# Patient Record
Sex: Female | Born: 1935 | Race: Black or African American | Hispanic: No | Smoking: Never smoker
Health system: Southern US, Community
[De-identification: ages and names within clinical notes are randomized; demographics above are authoritative.]

## PROBLEM LIST (undated history)

## (undated) DIAGNOSIS — D649 Anemia, unspecified: Secondary | ICD-10-CM

## (undated) DIAGNOSIS — K922 Gastrointestinal hemorrhage, unspecified: Secondary | ICD-10-CM

## (undated) DIAGNOSIS — E039 Hypothyroidism, unspecified: Secondary | ICD-10-CM

## (undated) DIAGNOSIS — I10 Essential (primary) hypertension: Secondary | ICD-10-CM

## (undated) DIAGNOSIS — K921 Melena: Secondary | ICD-10-CM

## (undated) DIAGNOSIS — N183 Chronic kidney disease, stage 3 unspecified: Secondary | ICD-10-CM

## (undated) DIAGNOSIS — F329 Major depressive disorder, single episode, unspecified: Secondary | ICD-10-CM

## (undated) DIAGNOSIS — M1990 Unspecified osteoarthritis, unspecified site: Secondary | ICD-10-CM

## (undated) DIAGNOSIS — E669 Obesity, unspecified: Secondary | ICD-10-CM

## (undated) DIAGNOSIS — I499 Cardiac arrhythmia, unspecified: Secondary | ICD-10-CM

## (undated) DIAGNOSIS — D61818 Other pancytopenia: Principal | ICD-10-CM

## (undated) DIAGNOSIS — D696 Thrombocytopenia, unspecified: Secondary | ICD-10-CM

## (undated) DIAGNOSIS — F32A Depression, unspecified: Secondary | ICD-10-CM

## (undated) DIAGNOSIS — F419 Anxiety disorder, unspecified: Secondary | ICD-10-CM

## (undated) HISTORY — DX: Melena: K92.1

## (undated) HISTORY — PX: CATARACT EXTRACTION, BILATERAL: SHX1313

## (undated) HISTORY — DX: Thrombocytopenia, unspecified: D69.6

## (undated) HISTORY — DX: Depression, unspecified: F32.A

## (undated) HISTORY — PX: ABDOMINAL HYSTERECTOMY: SHX81

## (undated) HISTORY — DX: Cardiac arrhythmia, unspecified: I49.9

## (undated) HISTORY — DX: Chronic kidney disease, stage 3 unspecified: N18.30

## (undated) HISTORY — DX: Other pancytopenia: D61.818

## (undated) HISTORY — DX: Chronic kidney disease, stage 3 (moderate): N18.3

## (undated) HISTORY — DX: Gastrointestinal hemorrhage, unspecified: K92.2

## (undated) HISTORY — DX: Anemia, unspecified: D64.9

## (undated) HISTORY — DX: Hypothyroidism, unspecified: E03.9

## (undated) HISTORY — DX: Major depressive disorder, single episode, unspecified: F32.9

## (undated) HISTORY — DX: Unspecified osteoarthritis, unspecified site: M19.90

## (undated) HISTORY — DX: Obesity, unspecified: E66.9

---

## 2000-08-02 DIAGNOSIS — K921 Melena: Secondary | ICD-10-CM

## 2000-08-02 HISTORY — PX: COLONOSCOPY: SHX174

## 2000-08-02 HISTORY — DX: Melena: K92.1

## 2001-06-07 ENCOUNTER — Ambulatory Visit (HOSPITAL_COMMUNITY): Admission: RE | Admit: 2001-06-07 | Discharge: 2001-06-07 | Payer: Self-pay | Admitting: General Surgery

## 2002-08-02 DIAGNOSIS — K922 Gastrointestinal hemorrhage, unspecified: Secondary | ICD-10-CM

## 2002-08-02 HISTORY — DX: Gastrointestinal hemorrhage, unspecified: K92.2

## 2002-10-18 ENCOUNTER — Encounter: Payer: Self-pay | Admitting: General Surgery

## 2002-10-18 ENCOUNTER — Encounter (HOSPITAL_COMMUNITY): Admission: RE | Admit: 2002-10-18 | Discharge: 2002-11-17 | Payer: Self-pay | Admitting: General Surgery

## 2003-01-22 ENCOUNTER — Encounter: Payer: Self-pay | Admitting: *Deleted

## 2003-01-22 ENCOUNTER — Inpatient Hospital Stay (HOSPITAL_COMMUNITY): Admission: EM | Admit: 2003-01-22 | Discharge: 2003-01-27 | Payer: Self-pay | Admitting: Emergency Medicine

## 2003-01-23 ENCOUNTER — Encounter: Payer: Self-pay | Admitting: Internal Medicine

## 2003-01-25 HISTORY — PX: COLONOSCOPY: SHX174

## 2003-03-19 ENCOUNTER — Encounter: Payer: Self-pay | Admitting: Orthopedic Surgery

## 2003-03-25 ENCOUNTER — Encounter: Payer: Self-pay | Admitting: Orthopedic Surgery

## 2003-03-25 ENCOUNTER — Inpatient Hospital Stay (HOSPITAL_COMMUNITY): Admission: RE | Admit: 2003-03-25 | Discharge: 2003-03-29 | Payer: Self-pay | Admitting: Orthopedic Surgery

## 2003-03-29 ENCOUNTER — Inpatient Hospital Stay (HOSPITAL_COMMUNITY)
Admission: RE | Admit: 2003-03-29 | Discharge: 2003-04-05 | Payer: Self-pay | Admitting: Physical Medicine & Rehabilitation

## 2003-05-03 HISTORY — PX: TOTAL HIP ARTHROPLASTY: SHX124

## 2006-04-15 ENCOUNTER — Ambulatory Visit (HOSPITAL_COMMUNITY): Admission: RE | Admit: 2006-04-15 | Discharge: 2006-04-15 | Payer: Self-pay | Admitting: Internal Medicine

## 2006-04-15 ENCOUNTER — Ambulatory Visit: Payer: Self-pay | Admitting: Internal Medicine

## 2006-05-04 ENCOUNTER — Ambulatory Visit: Payer: Self-pay | Admitting: Internal Medicine

## 2006-08-08 ENCOUNTER — Ambulatory Visit: Payer: Self-pay | Admitting: Internal Medicine

## 2006-08-09 ENCOUNTER — Encounter (INDEPENDENT_AMBULATORY_CARE_PROVIDER_SITE_OTHER): Payer: Self-pay | Admitting: Internal Medicine

## 2006-08-09 LAB — CONVERTED CEMR LAB
CO2: 28 meq/L (ref 19–32)
Cholesterol: 234 mg/dL — ABNORMAL HIGH (ref 0–200)
Eosinophils Relative: 3 % (ref 0–5)
HCT: 37.6 % (ref 36.0–46.0)
HDL: 78 mg/dL (ref >39)
Lymphocytes Relative: 41 % (ref 12–46)
Lymphs Abs: 1.4 10*3/uL (ref 0.7–3.3)
Monocytes Absolute: 0.2 10*3/uL (ref 0.2–0.7)
Monocytes Relative: 6 % (ref 3–11)
Neutrophils Relative %: 50 % (ref 43–77)
Platelets: 156 10*3/uL (ref 150–400)
Potassium: 4.3 meq/L (ref 3.5–5.3)
RBC count: 3.97 10*6/uL
Sodium: 140 meq/L (ref 135–145)
Total CHOL/HDL Ratio: 3
VLDL: 11 mg/dL (ref 0–40)
WBC: 3.3 10*3/uL — ABNORMAL LOW (ref 4.0–10.5)

## 2006-08-10 ENCOUNTER — Encounter: Payer: Self-pay | Admitting: Internal Medicine

## 2006-08-10 DIAGNOSIS — M1990 Unspecified osteoarthritis, unspecified site: Secondary | ICD-10-CM | POA: Insufficient documentation

## 2006-08-10 DIAGNOSIS — F329 Major depressive disorder, single episode, unspecified: Secondary | ICD-10-CM

## 2006-08-10 DIAGNOSIS — K922 Gastrointestinal hemorrhage, unspecified: Secondary | ICD-10-CM | POA: Insufficient documentation

## 2006-08-10 DIAGNOSIS — F411 Generalized anxiety disorder: Secondary | ICD-10-CM | POA: Insufficient documentation

## 2006-11-07 ENCOUNTER — Ambulatory Visit: Payer: Self-pay | Admitting: Internal Medicine

## 2007-02-06 ENCOUNTER — Ambulatory Visit: Payer: Self-pay | Admitting: Internal Medicine

## 2007-02-06 DIAGNOSIS — R5381 Other malaise: Secondary | ICD-10-CM

## 2007-02-06 DIAGNOSIS — R5383 Other fatigue: Secondary | ICD-10-CM

## 2007-02-07 ENCOUNTER — Encounter (INDEPENDENT_AMBULATORY_CARE_PROVIDER_SITE_OTHER): Payer: Self-pay | Admitting: Internal Medicine

## 2007-02-08 ENCOUNTER — Telehealth (INDEPENDENT_AMBULATORY_CARE_PROVIDER_SITE_OTHER): Payer: Self-pay | Admitting: *Deleted

## 2007-02-08 LAB — CONVERTED CEMR LAB
BUN: 26 mg/dL — ABNORMAL HIGH (ref 6–23)
Basophils Absolute: 0 10*3/uL (ref 0.0–0.1)
Basophils Relative: 0 % (ref 0–1)
Creatinine, Ser: 1.48 mg/dL — ABNORMAL HIGH (ref 0.40–1.20)
Eosinophils Absolute: 0.1 10*3/uL (ref 0.0–0.7)
Eosinophils Relative: 3 % (ref 0–5)
Lymphocytes Relative: 47 % — ABNORMAL HIGH (ref 12–46)
Lymphs Abs: 1.4 10*3/uL (ref 0.7–3.3)
MCV: 93.2 fL (ref 78.0–100.0)
Monocytes Absolute: 0.3 10*3/uL (ref 0.2–0.7)
Monocytes Relative: 9 % (ref 3–11)
Neutrophils Relative %: 41 % — ABNORMAL LOW (ref 43–77)
Platelets: 140 10*3/uL — ABNORMAL LOW (ref 150–400)
Potassium: 3.9 meq/L (ref 3.5–5.3)
RBC: 3.68 M/uL — ABNORMAL LOW (ref 3.87–5.11)
WBC: 3 10*3/uL — ABNORMAL LOW (ref 4.0–10.5)

## 2007-02-24 ENCOUNTER — Encounter (HOSPITAL_COMMUNITY): Admission: RE | Admit: 2007-02-24 | Discharge: 2007-03-26 | Payer: Self-pay | Admitting: Oncology

## 2007-02-24 ENCOUNTER — Encounter (INDEPENDENT_AMBULATORY_CARE_PROVIDER_SITE_OTHER): Payer: Self-pay | Admitting: Internal Medicine

## 2007-02-24 ENCOUNTER — Ambulatory Visit (HOSPITAL_COMMUNITY): Payer: Self-pay | Admitting: Oncology

## 2007-02-27 ENCOUNTER — Telehealth (INDEPENDENT_AMBULATORY_CARE_PROVIDER_SITE_OTHER): Payer: Self-pay | Admitting: *Deleted

## 2007-03-01 ENCOUNTER — Ambulatory Visit: Payer: Self-pay | Admitting: Internal Medicine

## 2007-03-01 DIAGNOSIS — E039 Hypothyroidism, unspecified: Secondary | ICD-10-CM | POA: Insufficient documentation

## 2007-03-03 ENCOUNTER — Encounter (INDEPENDENT_AMBULATORY_CARE_PROVIDER_SITE_OTHER): Payer: Self-pay | Admitting: Internal Medicine

## 2007-03-06 ENCOUNTER — Encounter (INDEPENDENT_AMBULATORY_CARE_PROVIDER_SITE_OTHER): Payer: Self-pay | Admitting: Internal Medicine

## 2007-03-20 ENCOUNTER — Encounter (INDEPENDENT_AMBULATORY_CARE_PROVIDER_SITE_OTHER): Payer: Self-pay | Admitting: Internal Medicine

## 2007-03-21 ENCOUNTER — Telehealth (INDEPENDENT_AMBULATORY_CARE_PROVIDER_SITE_OTHER): Payer: Self-pay | Admitting: *Deleted

## 2007-03-21 LAB — CONVERTED CEMR LAB: TSH: 2.896 microintl units/mL (ref 0.350–5.50)

## 2007-04-20 ENCOUNTER — Encounter (HOSPITAL_COMMUNITY): Admission: RE | Admit: 2007-04-20 | Discharge: 2007-05-02 | Payer: Self-pay | Admitting: Oncology

## 2007-04-20 ENCOUNTER — Ambulatory Visit (HOSPITAL_COMMUNITY): Payer: Self-pay | Admitting: Oncology

## 2007-04-21 ENCOUNTER — Encounter (INDEPENDENT_AMBULATORY_CARE_PROVIDER_SITE_OTHER): Payer: Self-pay | Admitting: Internal Medicine

## 2007-05-08 ENCOUNTER — Ambulatory Visit: Payer: Self-pay | Admitting: Internal Medicine

## 2007-05-08 LAB — CONVERTED CEMR LAB
Nitrite: NEGATIVE
Protein, U semiquant: NEGATIVE
Specific Gravity, Urine: 1.02
Urobilinogen, UA: 0.2

## 2007-05-09 ENCOUNTER — Telehealth (INDEPENDENT_AMBULATORY_CARE_PROVIDER_SITE_OTHER): Payer: Self-pay | Admitting: *Deleted

## 2007-05-12 ENCOUNTER — Ambulatory Visit (HOSPITAL_COMMUNITY): Admission: RE | Admit: 2007-05-12 | Discharge: 2007-05-12 | Payer: Self-pay | Admitting: Internal Medicine

## 2007-08-17 ENCOUNTER — Ambulatory Visit (HOSPITAL_COMMUNITY): Payer: Self-pay | Admitting: Oncology

## 2007-08-17 ENCOUNTER — Encounter (HOSPITAL_COMMUNITY): Admission: RE | Admit: 2007-08-17 | Discharge: 2007-09-16 | Payer: Self-pay | Admitting: Oncology

## 2007-08-17 ENCOUNTER — Encounter (INDEPENDENT_AMBULATORY_CARE_PROVIDER_SITE_OTHER): Payer: Self-pay | Admitting: Internal Medicine

## 2007-08-17 LAB — CONVERTED CEMR LAB
AST: 20 units/L
Albumin: 3.6 g/dL
CO2: 29 meq/L
Chloride: 105 meq/L
Cholesterol: 207 mg/dL
Glucose, Bld: 91 mg/dL
HDL: 68 mg/dL
LDL Cholesterol: 125 mg/dL
Total Protein: 7.5 g/dL
Triglycerides: 71 mg/dL

## 2007-08-18 ENCOUNTER — Encounter (INDEPENDENT_AMBULATORY_CARE_PROVIDER_SITE_OTHER): Payer: Self-pay | Admitting: Internal Medicine

## 2007-08-24 ENCOUNTER — Encounter (INDEPENDENT_AMBULATORY_CARE_PROVIDER_SITE_OTHER): Payer: Self-pay | Admitting: Internal Medicine

## 2007-08-28 ENCOUNTER — Ambulatory Visit: Payer: Self-pay | Admitting: Internal Medicine

## 2007-11-27 ENCOUNTER — Ambulatory Visit: Payer: Self-pay | Admitting: Internal Medicine

## 2007-11-27 DIAGNOSIS — G4700 Insomnia, unspecified: Secondary | ICD-10-CM

## 2007-11-30 ENCOUNTER — Encounter (INDEPENDENT_AMBULATORY_CARE_PROVIDER_SITE_OTHER): Payer: Self-pay | Admitting: Internal Medicine

## 2007-12-14 ENCOUNTER — Encounter (HOSPITAL_COMMUNITY): Admission: RE | Admit: 2007-12-14 | Discharge: 2008-01-13 | Payer: Self-pay | Admitting: Oncology

## 2007-12-14 ENCOUNTER — Ambulatory Visit (HOSPITAL_COMMUNITY): Payer: Self-pay | Admitting: Oncology

## 2007-12-20 ENCOUNTER — Encounter (INDEPENDENT_AMBULATORY_CARE_PROVIDER_SITE_OTHER): Payer: Self-pay | Admitting: Internal Medicine

## 2007-12-20 LAB — CONVERTED CEMR LAB
ALT: 16 units/L
AST: 26 units/L
Chloride: 105 meq/L
Cholesterol: 215 mg/dL
Creatinine, Ser: 1.22 mg/dL
Free T4: 1.24 ng/dL
Glucose, Bld: 103 mg/dL
Potassium: 3.8 meq/L
Sodium: 140 meq/L
TSH: 3.664 microintl units/mL
Total Bilirubin: 0.5 mg/dL
Total Protein: 7.7 g/dL
Triglycerides: 52 mg/dL

## 2008-02-26 ENCOUNTER — Ambulatory Visit: Payer: Self-pay | Admitting: Internal Medicine

## 2008-02-26 DIAGNOSIS — M25519 Pain in unspecified shoulder: Secondary | ICD-10-CM

## 2008-06-17 ENCOUNTER — Ambulatory Visit: Payer: Self-pay | Admitting: Internal Medicine

## 2008-06-17 ENCOUNTER — Encounter (HOSPITAL_COMMUNITY): Admission: RE | Admit: 2008-06-17 | Discharge: 2008-07-17 | Payer: Self-pay | Admitting: Oncology

## 2008-06-17 ENCOUNTER — Ambulatory Visit (HOSPITAL_COMMUNITY): Payer: Self-pay | Admitting: Oncology

## 2008-06-18 ENCOUNTER — Encounter (INDEPENDENT_AMBULATORY_CARE_PROVIDER_SITE_OTHER): Payer: Self-pay | Admitting: Internal Medicine

## 2008-06-18 LAB — CONVERTED CEMR LAB
BUN: 17 mg/dL (ref 6–23)
Creatinine, Ser: 1.11 mg/dL (ref 0.40–1.20)
Free T4: 1.21 ng/dL (ref 0.89–1.80)
Potassium: 4 meq/L (ref 3.5–5.3)
Sodium: 141 meq/L (ref 135–145)

## 2008-06-19 ENCOUNTER — Ambulatory Visit (HOSPITAL_COMMUNITY): Admission: RE | Admit: 2008-06-19 | Discharge: 2008-06-19 | Payer: Self-pay | Admitting: Internal Medicine

## 2008-06-19 ENCOUNTER — Encounter (INDEPENDENT_AMBULATORY_CARE_PROVIDER_SITE_OTHER): Payer: Self-pay | Admitting: Internal Medicine

## 2008-07-22 ENCOUNTER — Encounter (INDEPENDENT_AMBULATORY_CARE_PROVIDER_SITE_OTHER): Payer: Self-pay | Admitting: Internal Medicine

## 2008-09-04 ENCOUNTER — Telehealth (INDEPENDENT_AMBULATORY_CARE_PROVIDER_SITE_OTHER): Payer: Self-pay | Admitting: *Deleted

## 2008-09-17 ENCOUNTER — Ambulatory Visit: Payer: Self-pay | Admitting: Internal Medicine

## 2008-09-25 ENCOUNTER — Encounter (INDEPENDENT_AMBULATORY_CARE_PROVIDER_SITE_OTHER): Payer: Self-pay | Admitting: Internal Medicine

## 2008-09-26 LAB — CONVERTED CEMR LAB
Calcium: 9.5 mg/dL (ref 8.4–10.5)
Chloride: 103 meq/L (ref 96–112)
Creatinine, Ser: 1.27 mg/dL — ABNORMAL HIGH (ref 0.40–1.20)
Potassium: 4.2 meq/L (ref 3.5–5.3)
Sodium: 139 meq/L (ref 135–145)

## 2008-12-05 ENCOUNTER — Telehealth (INDEPENDENT_AMBULATORY_CARE_PROVIDER_SITE_OTHER): Payer: Self-pay | Admitting: Internal Medicine

## 2008-12-16 ENCOUNTER — Encounter (HOSPITAL_COMMUNITY): Admission: RE | Admit: 2008-12-16 | Discharge: 2009-01-15 | Payer: Self-pay | Admitting: Oncology

## 2008-12-16 ENCOUNTER — Ambulatory Visit (HOSPITAL_COMMUNITY): Payer: Self-pay | Admitting: Oncology

## 2008-12-25 ENCOUNTER — Telehealth (INDEPENDENT_AMBULATORY_CARE_PROVIDER_SITE_OTHER): Payer: Self-pay | Admitting: Internal Medicine

## 2009-01-03 ENCOUNTER — Ambulatory Visit: Payer: Self-pay | Admitting: Cardiology

## 2009-01-03 ENCOUNTER — Encounter (HOSPITAL_COMMUNITY): Payer: Self-pay | Admitting: Oncology

## 2009-01-06 ENCOUNTER — Encounter (INDEPENDENT_AMBULATORY_CARE_PROVIDER_SITE_OTHER): Payer: Self-pay | Admitting: Internal Medicine

## 2009-02-10 ENCOUNTER — Ambulatory Visit: Payer: Self-pay | Admitting: Internal Medicine

## 2009-02-10 ENCOUNTER — Ambulatory Visit (HOSPITAL_COMMUNITY): Admission: RE | Admit: 2009-02-10 | Discharge: 2009-02-10 | Payer: Self-pay | Admitting: Internal Medicine

## 2009-02-10 DIAGNOSIS — R0989 Other specified symptoms and signs involving the circulatory and respiratory systems: Secondary | ICD-10-CM

## 2009-02-10 DIAGNOSIS — R0609 Other forms of dyspnea: Secondary | ICD-10-CM

## 2009-02-10 DIAGNOSIS — R609 Edema, unspecified: Secondary | ICD-10-CM

## 2009-02-17 ENCOUNTER — Encounter (INDEPENDENT_AMBULATORY_CARE_PROVIDER_SITE_OTHER): Payer: Self-pay | Admitting: Internal Medicine

## 2009-02-19 ENCOUNTER — Encounter (INDEPENDENT_AMBULATORY_CARE_PROVIDER_SITE_OTHER): Payer: Self-pay | Admitting: Internal Medicine

## 2009-03-17 ENCOUNTER — Ambulatory Visit (HOSPITAL_COMMUNITY): Payer: Self-pay | Admitting: Oncology

## 2009-03-17 ENCOUNTER — Encounter (HOSPITAL_COMMUNITY): Admission: RE | Admit: 2009-03-17 | Discharge: 2009-04-16 | Payer: Self-pay | Admitting: Oncology

## 2009-04-10 ENCOUNTER — Encounter (INDEPENDENT_AMBULATORY_CARE_PROVIDER_SITE_OTHER): Payer: Self-pay | Admitting: Internal Medicine

## 2009-06-16 ENCOUNTER — Ambulatory Visit (HOSPITAL_COMMUNITY): Payer: Self-pay | Admitting: Oncology

## 2009-06-16 ENCOUNTER — Encounter (HOSPITAL_COMMUNITY): Admission: RE | Admit: 2009-06-16 | Discharge: 2009-07-16 | Payer: Self-pay | Admitting: Oncology

## 2009-06-25 ENCOUNTER — Ambulatory Visit (HOSPITAL_COMMUNITY): Admission: RE | Admit: 2009-06-25 | Discharge: 2009-06-25 | Payer: Self-pay | Admitting: Family Medicine

## 2009-09-08 ENCOUNTER — Encounter (HOSPITAL_COMMUNITY): Admission: RE | Admit: 2009-09-08 | Discharge: 2009-10-08 | Payer: Self-pay | Admitting: Oncology

## 2009-09-08 ENCOUNTER — Ambulatory Visit (HOSPITAL_COMMUNITY): Payer: Self-pay | Admitting: Oncology

## 2009-11-28 ENCOUNTER — Encounter: Payer: Self-pay | Admitting: Family Medicine

## 2009-12-02 ENCOUNTER — Encounter (HOSPITAL_COMMUNITY): Admission: RE | Admit: 2009-12-02 | Discharge: 2010-01-01 | Payer: Self-pay | Admitting: Oncology

## 2009-12-02 ENCOUNTER — Ambulatory Visit (HOSPITAL_COMMUNITY): Payer: Self-pay | Admitting: Oncology

## 2010-03-09 ENCOUNTER — Ambulatory Visit (HOSPITAL_COMMUNITY): Payer: Self-pay | Admitting: Oncology

## 2010-03-09 ENCOUNTER — Encounter (HOSPITAL_COMMUNITY): Admission: RE | Admit: 2010-03-09 | Discharge: 2010-04-08 | Payer: Self-pay | Admitting: Oncology

## 2010-06-08 ENCOUNTER — Ambulatory Visit (HOSPITAL_COMMUNITY): Payer: Self-pay | Admitting: Oncology

## 2010-06-08 ENCOUNTER — Encounter (HOSPITAL_COMMUNITY)
Admission: RE | Admit: 2010-06-08 | Discharge: 2010-07-08 | Payer: Self-pay | Source: Home / Self Care | Admitting: Oncology

## 2010-06-29 ENCOUNTER — Ambulatory Visit (HOSPITAL_COMMUNITY): Admission: RE | Admit: 2010-06-29 | Discharge: 2010-06-29 | Payer: Self-pay | Admitting: Family Medicine

## 2010-08-23 ENCOUNTER — Encounter: Payer: Self-pay | Admitting: Internal Medicine

## 2010-09-01 NOTE — Letter (Signed)
Summary: medical release  medical release   Imported By: Lind Guest 11/28/2009 14:03:27  _____________________________________________________________________  External Attachment:    Type:   Image     Comment:   External Document

## 2010-09-08 ENCOUNTER — Emergency Department (HOSPITAL_COMMUNITY): Payer: Medicare Other

## 2010-09-08 ENCOUNTER — Emergency Department (HOSPITAL_COMMUNITY)
Admission: EM | Admit: 2010-09-08 | Discharge: 2010-09-08 | Disposition: A | Discharge status: Home / Self Care | Payer: Medicare Other | Attending: Emergency Medicine | Admitting: Emergency Medicine

## 2010-09-08 DIAGNOSIS — F341 Dysthymic disorder: Secondary | ICD-10-CM | POA: Insufficient documentation

## 2010-09-08 DIAGNOSIS — K8050 Calculus of bile duct without cholangitis or cholecystitis without obstruction: Secondary | ICD-10-CM | POA: Insufficient documentation

## 2010-09-08 DIAGNOSIS — Z79899 Other long term (current) drug therapy: Secondary | ICD-10-CM | POA: Insufficient documentation

## 2010-09-08 DIAGNOSIS — M79609 Pain in unspecified limb: Secondary | ICD-10-CM | POA: Insufficient documentation

## 2010-09-08 DIAGNOSIS — M19049 Primary osteoarthritis, unspecified hand: Secondary | ICD-10-CM | POA: Insufficient documentation

## 2010-09-08 DIAGNOSIS — R10811 Right upper quadrant abdominal tenderness: Secondary | ICD-10-CM | POA: Insufficient documentation

## 2010-09-08 DIAGNOSIS — R1013 Epigastric pain: Secondary | ICD-10-CM | POA: Insufficient documentation

## 2010-09-08 DIAGNOSIS — E039 Hypothyroidism, unspecified: Secondary | ICD-10-CM | POA: Insufficient documentation

## 2010-09-08 DIAGNOSIS — I10 Essential (primary) hypertension: Secondary | ICD-10-CM | POA: Insufficient documentation

## 2010-09-08 LAB — URINALYSIS, ROUTINE W REFLEX MICROSCOPIC
Hgb urine dipstick: NEGATIVE
Nitrite: NEGATIVE
Specific Gravity, Urine: 1.02 (ref 1.005–1.030)
Urine Glucose, Fasting: NEGATIVE mg/dL
Urobilinogen, UA: 0.2 mg/dL (ref 0.0–1.0)

## 2010-09-08 LAB — CBC
HCT: 33.6 % — ABNORMAL LOW (ref 36.0–46.0)
MCH: 30.6 pg (ref 26.0–34.0)
Platelets: 145 10*3/uL — ABNORMAL LOW (ref 150–400)
RBC: 3.69 MIL/uL — ABNORMAL LOW (ref 3.87–5.11)
RDW: 12.6 % (ref 11.5–15.5)

## 2010-09-08 LAB — LIPASE, BLOOD: Lipase: 33 U/L (ref 11–59)

## 2010-09-08 LAB — DIFFERENTIAL
Basophils Absolute: 0 10*3/uL (ref 0.0–0.1)
Lymphs Abs: 2.2 10*3/uL (ref 0.7–4.0)
Monocytes Absolute: 0.3 10*3/uL (ref 0.1–1.0)

## 2010-09-08 LAB — TROPONIN I: Troponin I: 0.01 ng/mL (ref 0.00–0.06)

## 2010-09-08 LAB — COMPREHENSIVE METABOLIC PANEL
Albumin: 4 g/dL (ref 3.5–5.2)
Alkaline Phosphatase: 86 U/L (ref 39–117)
BUN: 24 mg/dL — ABNORMAL HIGH (ref 6–23)
CO2: 29 mEq/L (ref 19–32)
Calcium: 9.8 mg/dL (ref 8.4–10.5)
Creatinine, Ser: 1.39 mg/dL — ABNORMAL HIGH (ref 0.4–1.2)
GFR calc Af Amer: 44 mL/min — ABNORMAL LOW (ref >60)
GFR calc non Af Amer: 37 mL/min — ABNORMAL LOW (ref >60)
Sodium: 140 mEq/L (ref 135–145)

## 2010-09-08 LAB — CK TOTAL AND CKMB (NOT AT ARMC)
CK, MB: 2.6 ng/mL (ref 0.3–4.0)
Total CK: 194 U/L — ABNORMAL HIGH (ref 7–177)

## 2010-09-09 ENCOUNTER — Other Ambulatory Visit (HOSPITAL_COMMUNITY): Payer: Self-pay | Admitting: Oncology

## 2010-09-09 ENCOUNTER — Other Ambulatory Visit (HOSPITAL_COMMUNITY): Payer: MEDICARE

## 2010-09-09 ENCOUNTER — Encounter (HOSPITAL_COMMUNITY): Payer: MEDICARE | Attending: Oncology

## 2010-09-09 DIAGNOSIS — I10 Essential (primary) hypertension: Secondary | ICD-10-CM | POA: Insufficient documentation

## 2010-09-09 DIAGNOSIS — E039 Hypothyroidism, unspecified: Secondary | ICD-10-CM | POA: Insufficient documentation

## 2010-09-09 DIAGNOSIS — D61818 Other pancytopenia: Secondary | ICD-10-CM | POA: Insufficient documentation

## 2010-09-09 DIAGNOSIS — Z79899 Other long term (current) drug therapy: Secondary | ICD-10-CM | POA: Insufficient documentation

## 2010-09-09 LAB — CBC
HCT: 32.8 % — ABNORMAL LOW (ref 36.0–46.0)
Hemoglobin: 10.9 g/dL — ABNORMAL LOW (ref 12.0–15.0)
MCH: 30.4 pg (ref 26.0–34.0)
MCV: 91.6 fL (ref 78.0–100.0)
Platelets: 133 10*3/uL — ABNORMAL LOW (ref 150–400)

## 2010-09-09 LAB — DIFFERENTIAL
Basophils Relative: 1 % (ref 0–1)
Eosinophils Absolute: 0.1 10*3/uL (ref 0.0–0.7)
Eosinophils Relative: 2 % (ref 0–5)
Neutrophils Relative %: 54 % (ref 43–77)

## 2010-09-11 ENCOUNTER — Other Ambulatory Visit (HOSPITAL_COMMUNITY): Payer: Self-pay | Admitting: Family Medicine

## 2010-09-14 ENCOUNTER — Ambulatory Visit (HOSPITAL_COMMUNITY)
Admission: RE | Admit: 2010-09-14 | Discharge: 2010-09-14 | Disposition: A | Discharge status: Home / Self Care | Payer: MEDICARE | Source: Ambulatory Visit | Attending: Family Medicine | Admitting: Family Medicine

## 2010-09-14 ENCOUNTER — Encounter (HOSPITAL_COMMUNITY): Payer: Self-pay

## 2010-09-14 DIAGNOSIS — R109 Unspecified abdominal pain: Secondary | ICD-10-CM | POA: Insufficient documentation

## 2010-09-14 HISTORY — DX: Essential (primary) hypertension: I10

## 2010-09-14 MED ORDER — TECHNETIUM TC 99M MEBROFENIN IV KIT
5.0000 | PACK | Freq: Once | INTRAVENOUS | Status: AC | PRN
Start: 2010-09-14 — End: 2010-09-14
  Administered 2010-09-14: 5.5 via INTRAVENOUS

## 2010-10-01 HISTORY — PX: CHOLECYSTECTOMY: SHX55

## 2010-10-07 ENCOUNTER — Encounter (HOSPITAL_COMMUNITY): Payer: MEDICARE

## 2010-10-07 ENCOUNTER — Other Ambulatory Visit (HOSPITAL_COMMUNITY): Payer: Self-pay | Admitting: Surgery

## 2010-10-07 ENCOUNTER — Ambulatory Visit (HOSPITAL_COMMUNITY)
Admission: RE | Admit: 2010-10-07 | Discharge: 2010-10-07 | Disposition: A | Discharge status: Home / Self Care | Payer: MEDICARE | Source: Ambulatory Visit | Attending: Surgery | Admitting: Surgery

## 2010-10-07 DIAGNOSIS — Z01812 Encounter for preprocedural laboratory examination: Secondary | ICD-10-CM | POA: Insufficient documentation

## 2010-10-07 DIAGNOSIS — Z79899 Other long term (current) drug therapy: Secondary | ICD-10-CM | POA: Insufficient documentation

## 2010-10-07 DIAGNOSIS — I10 Essential (primary) hypertension: Secondary | ICD-10-CM | POA: Insufficient documentation

## 2010-10-07 DIAGNOSIS — K8020 Calculus of gallbladder without cholecystitis without obstruction: Secondary | ICD-10-CM

## 2010-10-07 DIAGNOSIS — Z01818 Encounter for other preprocedural examination: Secondary | ICD-10-CM | POA: Insufficient documentation

## 2010-10-07 LAB — COMPREHENSIVE METABOLIC PANEL
Alkaline Phosphatase: 93 U/L (ref 39–117)
BUN: 16 mg/dL (ref 6–23)
Calcium: 9.6 mg/dL (ref 8.4–10.5)
Creatinine, Ser: 1.28 mg/dL — ABNORMAL HIGH (ref 0.4–1.2)
Glucose, Bld: 91 mg/dL (ref 70–99)
Potassium: 3.7 mEq/L (ref 3.5–5.1)
Total Protein: 7.6 g/dL (ref 6.0–8.3)

## 2010-10-07 LAB — SURGICAL PCR SCREEN
MRSA, PCR: NEGATIVE
Staphylococcus aureus: NEGATIVE

## 2010-10-07 LAB — DIFFERENTIAL
Basophils Absolute: 0 10*3/uL (ref 0.0–0.1)
Basophils Relative: 0 % (ref 0–1)
Eosinophils Absolute: 0.1 10*3/uL (ref 0.0–0.7)
Monocytes Absolute: 0.4 10*3/uL (ref 0.1–1.0)
Neutro Abs: 2.4 10*3/uL (ref 1.7–7.7)
Neutrophils Relative %: 47 % (ref 43–77)

## 2010-10-07 LAB — URINALYSIS, ROUTINE W REFLEX MICROSCOPIC
Bilirubin Urine: NEGATIVE
Ketones, ur: NEGATIVE mg/dL
Nitrite: NEGATIVE
Protein, ur: NEGATIVE mg/dL
Urobilinogen, UA: 0.2 mg/dL (ref 0.0–1.0)

## 2010-10-07 LAB — CBC
MCH: 29 pg (ref 26.0–34.0)
MCHC: 30.9 g/dL (ref 30.0–36.0)
MCV: 93.6 fL (ref 78.0–100.0)
Platelets: 137 10*3/uL — ABNORMAL LOW (ref 150–400)
RDW: 12.5 % (ref 11.5–15.5)

## 2010-10-07 LAB — LIPASE, BLOOD: Lipase: 31 U/L (ref 11–59)

## 2010-10-12 ENCOUNTER — Other Ambulatory Visit: Payer: Self-pay | Admitting: Surgery

## 2010-10-12 ENCOUNTER — Observation Stay (HOSPITAL_COMMUNITY)
Admission: RE | Admit: 2010-10-12 | Discharge: 2010-10-13 | DRG: 419 | Disposition: A | Discharge status: Home / Self Care | Payer: Medicare Other | Source: Ambulatory Visit | Attending: Surgery | Admitting: Surgery

## 2010-10-12 ENCOUNTER — Inpatient Hospital Stay (HOSPITAL_COMMUNITY): Payer: Medicare Other

## 2010-10-12 DIAGNOSIS — I10 Essential (primary) hypertension: Secondary | ICD-10-CM | POA: Diagnosis present

## 2010-10-12 DIAGNOSIS — K8010 Calculus of gallbladder with chronic cholecystitis without obstruction: Principal | ICD-10-CM | POA: Diagnosis present

## 2010-10-12 DIAGNOSIS — E669 Obesity, unspecified: Secondary | ICD-10-CM | POA: Diagnosis present

## 2010-10-12 DIAGNOSIS — E039 Hypothyroidism, unspecified: Secondary | ICD-10-CM | POA: Diagnosis present

## 2010-10-13 LAB — DIFFERENTIAL
Basophils Absolute: 0 10*3/uL (ref 0.0–0.1)
Eosinophils Absolute: 0.2 10*3/uL (ref 0.0–0.7)
Lymphs Abs: 1.9 10*3/uL (ref 0.7–4.0)
Neutro Abs: 2.2 10*3/uL (ref 1.7–7.7)
Neutrophils Relative %: 46 % (ref 43–77)

## 2010-10-13 LAB — CBC
MCH: 30.8 pg (ref 26.0–34.0)
Platelets: 139 10*3/uL — ABNORMAL LOW (ref 150–400)
RBC: 3.43 MIL/uL — ABNORMAL LOW (ref 3.87–5.11)

## 2010-10-13 NOTE — Op Note (Signed)
NAME:  Tammy Sims, Tammy Sims              ACCOUNT NO.:  [id number]  MEDICAL RECORD NO.:  [id number]           PATIENT TYPE:  I  LOCATION:  X001                         FACILITY:  [facility]  PHYSICIAN:  Currie Paris, M.D.DATE OF BIRTH:  [birth year]  DATE OF PROCEDURE:  10/12/2010 DATE OF DISCHARGE:                              OPERATIVE REPORT   PREOPERATIVE DIAGNOSIS:  Chronic calculus cholecystitis.  POSTOPERATIVE DIAGNOSIS:  Chronic calculus cholecystitis.  PROCEDURE:  Laparoscopic cholecystectomy with operative cholangiogram.  SURGEON:  Currie Paris, MD  ANESTHESIA:  General.  CLINICAL HISTORY:  This is a 77 year old lady with biliary type symptoms and gallstones and elected to proceed to cholecystectomy.  DESCRIPTION OF PROCEDURE:  I saw the patient in the holding area and she had no further questions.  We reviewed the plans for the procedure as noted above.  The patient was taken to the operating room.  After satisfactory general endotracheal anesthesia had been obtained, the abdomen was prepped and draped.  The time-out was done.  I used 0.25% plain Marcaine for each incision.  The umbilical incision was made, the fascia identified, and a couple of old what looked like Prolene or Novofil sutures cut.  I was able to enter the peritoneal cavity going gently through the fascia and saw a few omental adhesions which were gently dissected down, so I could see into the peroneal cavity that there was no bowel close by.  I had a pursestring introduced via the thigh and inflated the abdomen to 15.  With the camera in, I could see there were few more adhesions along the midline, but I was able to see in the left upper quadrant and then into the right upper quadrant.  Under direct vision, I put a 10/11 trocar in the epigastrium.  I then put two 5 mm laterally again under direct vision.  With the camera then in the epigastric port, I took out a few out adhesions to make  sure around the area in the umbilicus, we did not have any bowel that might be injured and that we could easily replace the scope.  Once that was done, the camera was placed back in the umbilical port.  Some adhesions from the liver to the anterior abdominal wall were taken down to improve mobility.  There were multiple omental adhesions to the gallbladder which were taken down with the cautery.  The peritoneum over the cystic duct area was opened.  I made a nice large window and I could see that the anterior branch of the cystic artery was basically closely adherent to the cystic duct.  I dissected that off and divided it.  I now had a long segment of cystic duct and that was clipped.  A Cook catheter was introduced and operative cholangiography done which appeared to be normal, showing a long segment of cystic duct and nice filling in the common duct and duodenum with no filling defects.  The catheter was removed and 3 clips placed on the stay side of the cystic duct.  It was divided.  The posterior branch was clipped and divided and the gallbladder removed from  below to above.  I made sure everything was dry.  I put the gallbladder in bag and brought out to the umbilical site.  I occluded that for few moments while we reinsufflated and did a final irrigation check for hemostasis and again everything appeared okay.  The lateral ports removed under direct vision and did not bleed.  The umbilical port was closed with a pursestring.  There air was deflated through the epigastric port.  Skin was closed with 4-0 Monocryl subcuticular plus Dermabond.  The patient tolerated the procedure well and there were no complications.  All counts were correct.     Currie Paris, M.D.     CJS/MEDQ  D:  10/12/2010  T:  10/12/2010  Job:  [id number]  cc:   Mila Homer. Sudie Bailey, M.D. Fax: [phone]  Ladona Horns. Mariel Sleet, MD Fax: [phone]  Electronically Signed by Cyndia Bent M.D. on  10/13/2010 07:53:16 AM

## 2010-10-16 LAB — DIFFERENTIAL
Basophils Absolute: 0 10*3/uL (ref 0.0–0.1)
Basophils Relative: 0 % (ref 0–1)
Eosinophils Absolute: 0.1 10*3/uL (ref 0.0–0.7)
Eosinophils Relative: 3 % (ref 0–5)
Monocytes Absolute: 0.3 10*3/uL (ref 0.1–1.0)
Monocytes Relative: 9 % (ref 3–12)
Neutro Abs: 1.7 10*3/uL (ref 1.7–7.7)

## 2010-10-16 LAB — CBC
HCT: 31.5 % — ABNORMAL LOW (ref 36.0–46.0)
Hemoglobin: 10.6 g/dL — ABNORMAL LOW (ref 12.0–15.0)
MCH: 31.1 pg (ref 26.0–34.0)
MCHC: 33.7 g/dL (ref 30.0–36.0)
MCV: 92.3 fL (ref 78.0–100.0)
RDW: 13.2 % (ref 11.5–15.5)

## 2010-10-20 ENCOUNTER — Emergency Department (HOSPITAL_COMMUNITY)
Admission: EM | Admit: 2010-10-20 | Discharge: 2010-10-21 | Disposition: A | Discharge status: Home / Self Care | Payer: MEDICARE | Attending: Emergency Medicine | Admitting: Emergency Medicine

## 2010-10-20 ENCOUNTER — Emergency Department (HOSPITAL_COMMUNITY): Payer: MEDICARE

## 2010-10-20 DIAGNOSIS — R109 Unspecified abdominal pain: Secondary | ICD-10-CM | POA: Insufficient documentation

## 2010-10-20 DIAGNOSIS — R112 Nausea with vomiting, unspecified: Secondary | ICD-10-CM | POA: Insufficient documentation

## 2010-10-20 DIAGNOSIS — E039 Hypothyroidism, unspecified: Secondary | ICD-10-CM | POA: Insufficient documentation

## 2010-10-20 DIAGNOSIS — Z79899 Other long term (current) drug therapy: Secondary | ICD-10-CM | POA: Insufficient documentation

## 2010-10-20 DIAGNOSIS — R197 Diarrhea, unspecified: Secondary | ICD-10-CM | POA: Insufficient documentation

## 2010-10-20 DIAGNOSIS — I10 Essential (primary) hypertension: Secondary | ICD-10-CM | POA: Insufficient documentation

## 2010-10-20 DIAGNOSIS — F341 Dysthymic disorder: Secondary | ICD-10-CM | POA: Insufficient documentation

## 2010-10-20 LAB — DIFFERENTIAL
Basophils Absolute: 0 10*3/uL (ref 0.0–0.1)
Basophils Absolute: 0 10*3/uL (ref 0.0–0.1)
Basophils Relative: 1 % (ref 0–1)
Eosinophils Relative: 1 % (ref 0–5)
Lymphocytes Relative: 44 % (ref 12–46)
Lymphocytes Relative: 17 % (ref 12–46)
Lymphs Abs: 1.1 10*3/uL (ref 0.7–4.0)
Monocytes Absolute: 0.3 10*3/uL (ref 0.1–1.0)
Neutro Abs: 2 10*3/uL (ref 1.7–7.7)
Neutrophils Relative %: 45 % (ref 43–77)
Neutrophils Relative %: 79 % — ABNORMAL HIGH (ref 43–77)

## 2010-10-20 LAB — URINALYSIS, ROUTINE W REFLEX MICROSCOPIC
Glucose, UA: NEGATIVE mg/dL
Nitrite: NEGATIVE
Specific Gravity, Urine: 1.02 (ref 1.005–1.030)
pH: 7 (ref 5.0–8.0)

## 2010-10-20 LAB — BASIC METABOLIC PANEL
Chloride: 100 mEq/L (ref 96–112)
GFR calc non Af Amer: 48 mL/min — ABNORMAL LOW (ref >60)
Glucose, Bld: 117 mg/dL — ABNORMAL HIGH (ref 70–99)
Potassium: 3.9 mEq/L (ref 3.5–5.1)
Sodium: 135 mEq/L (ref 135–145)

## 2010-10-20 LAB — CBC
HCT: 32.7 % — ABNORMAL LOW (ref 36.0–46.0)
MCHC: 34.6 g/dL (ref 30.0–36.0)
MCV: 92.1 fL (ref 78.0–100.0)
Platelets: 128 10*3/uL — ABNORMAL LOW (ref 150–400)
Platelets: 163 10*3/uL (ref 150–400)
RBC: 3.55 MIL/uL — ABNORMAL LOW (ref 3.87–5.11)
RDW: 13.4 % (ref 11.5–15.5)
RDW: 12.9 % (ref 11.5–15.5)
WBC: 6.7 10*3/uL (ref 4.0–10.5)

## 2010-10-21 LAB — DIFFERENTIAL
Basophils Absolute: 0 10*3/uL (ref 0.0–0.1)
Eosinophils Relative: 3 % (ref 0–5)
Lymphocytes Relative: 45 % (ref 12–46)
Lymphs Abs: 1.7 10*3/uL (ref 0.7–4.0)
Monocytes Absolute: 0.3 10*3/uL (ref 0.1–1.0)
Monocytes Relative: 7 % (ref 3–12)
Neutro Abs: 1.7 10*3/uL (ref 1.7–7.7)

## 2010-10-21 LAB — CBC
HCT: 35.6 % — ABNORMAL LOW (ref 36.0–46.0)
Hemoglobin: 11.8 g/dL — ABNORMAL LOW (ref 12.0–15.0)
RBC: 3.83 MIL/uL — ABNORMAL LOW (ref 3.87–5.11)

## 2010-11-04 LAB — CBC
HCT: 32.7 % — ABNORMAL LOW (ref 36.0–46.0)
Hemoglobin: 11 g/dL — ABNORMAL LOW (ref 12.0–15.0)
MCHC: 33.5 g/dL (ref 30.0–36.0)
MCV: 93.5 fL (ref 78.0–100.0)
RDW: 13.4 % (ref 11.5–15.5)

## 2010-11-04 LAB — DIFFERENTIAL
Basophils Relative: 0 % (ref 0–1)
Eosinophils Relative: 2 % (ref 0–5)
Monocytes Absolute: 0.3 10*3/uL (ref 0.1–1.0)
Neutro Abs: 1.5 10*3/uL — ABNORMAL LOW (ref 1.7–7.7)
Neutrophils Relative %: 43 % (ref 43–77)

## 2010-11-07 LAB — CBC
MCHC: 34.4 g/dL (ref 30.0–36.0)
MCV: 92.6 fL (ref 78.0–100.0)
RBC: 3.55 MIL/uL — ABNORMAL LOW (ref 3.87–5.11)
RDW: 13 % (ref 11.5–15.5)

## 2010-11-07 LAB — DIFFERENTIAL
Basophils Relative: 1 % (ref 0–1)
Eosinophils Absolute: 0.1 10*3/uL (ref 0.0–0.7)
Monocytes Absolute: 0.4 10*3/uL (ref 0.1–1.0)
Monocytes Relative: 9 % (ref 3–12)
Neutrophils Relative %: 39 % — ABNORMAL LOW (ref 43–77)

## 2010-11-10 LAB — DIFFERENTIAL
Basophils Absolute: 0 10*3/uL (ref 0.0–0.1)
Basophils Relative: 1 % (ref 0–1)
Neutro Abs: 1.7 10*3/uL (ref 1.7–7.7)
Neutrophils Relative %: 43 % (ref 43–77)

## 2010-11-10 LAB — CBC
MCHC: 34.8 g/dL (ref 30.0–36.0)
RBC: 3.43 MIL/uL — ABNORMAL LOW (ref 3.87–5.11)
RDW: 12.5 % (ref 11.5–15.5)

## 2010-12-07 ENCOUNTER — Other Ambulatory Visit (HOSPITAL_COMMUNITY): Payer: Self-pay | Admitting: Oncology

## 2010-12-07 ENCOUNTER — Encounter (HOSPITAL_COMMUNITY): Payer: Medicare Other | Attending: Oncology

## 2010-12-07 DIAGNOSIS — D61818 Other pancytopenia: Secondary | ICD-10-CM | POA: Insufficient documentation

## 2010-12-07 DIAGNOSIS — Z79899 Other long term (current) drug therapy: Secondary | ICD-10-CM | POA: Insufficient documentation

## 2010-12-07 DIAGNOSIS — I10 Essential (primary) hypertension: Secondary | ICD-10-CM | POA: Insufficient documentation

## 2010-12-07 DIAGNOSIS — E039 Hypothyroidism, unspecified: Secondary | ICD-10-CM | POA: Insufficient documentation

## 2010-12-07 LAB — DIFFERENTIAL
Basophils Absolute: 0 10*3/uL (ref 0.0–0.1)
Eosinophils Absolute: 0.1 10*3/uL (ref 0.0–0.7)
Eosinophils Relative: 3 % (ref 0–5)
Lymphocytes Relative: 39 % (ref 12–46)
Monocytes Absolute: 0.3 10*3/uL (ref 0.1–1.0)

## 2010-12-07 LAB — CBC
HCT: 32.4 % — ABNORMAL LOW (ref 36.0–46.0)
MCH: 29.8 pg (ref 26.0–34.0)
MCHC: 31.8 g/dL (ref 30.0–36.0)
MCV: 93.6 fL (ref 78.0–100.0)
RDW: 12.6 % (ref 11.5–15.5)
WBC: 3.7 10*3/uL — ABNORMAL LOW (ref 4.0–10.5)

## 2010-12-09 ENCOUNTER — Ambulatory Visit (HOSPITAL_COMMUNITY): Payer: Self-pay

## 2010-12-17 NOTE — Discharge Summary (Signed)
  NAME:  Tammy Sims, Tammy Sims              ACCOUNT NO.:  [id number]  MEDICAL RECORD NO.:  [id number]           PATIENT TYPE:  LOCATION:                                 FACILITY:  PHYSICIAN:  Currie Paris, M.D.DATE OF BIRTH:  1929-12-10  DATE OF ADMISSION:  10/12/2010 DATE OF DISCHARGE:  10/13/2010                              DISCHARGE SUMMARY   FINAL DIAGNOSIS:  Chronic cholecystitis.  CLINICAL HISTORY:  This is a 77 year old lady who had biliary-type symptoms and what appeared to be small gallstones on ultrasound.  She was admitted electively for cholecystectomy.  HOSPITAL COURSE:  The patient was admitted and taken to the operating room.  She underwent a laparoscopic cholecystectomy.  She had multiple omental adhesions to the gallbladder, which were taken down and the procedure went normally.  Postoperatively, she was admitted for postoperative care.  The next morning she felt fine and was ambulating and tolerating diet.  Her abdomen was soft and vital signs had been stable.  She was felt able to be discharged and went home in satisfactory condition to resume her usual medications and with some postoperative pain medications. Pathology report shows chronic cholecystitis.  No calculi were noted in the gallbladder, however.     Currie Paris, M.D.     CJS/MEDQ  D:  12/14/2010  T:  12/14/2010  Job:  [id number]  cc:   Mila Homer. Sudie Bailey, M.D. Fax: [phone]  Electronically Signed by Cyndia Bent M.D. on 12/17/2010 03:40:13 PM

## 2010-12-18 NOTE — H&P (Signed)
NAME:  Tammy Sims, COUILLARD                        ACCOUNT NO.:  [id number]   MEDICAL RECORD NO.:  [id number]                   PATIENT TYPE:  INP   LOCATION:  NA                                   FACILITY:  [facility]   PHYSICIAN:  Ollen Gross, M.D.                 DATE OF BIRTH:  Apr 05, 1930   DATE OF ADMISSION:  03/25/2003  DATE OF DISCHARGE:                                HISTORY & PHYSICAL   DATE OF OFFICE VISIT HISTORY AND PHYSICAL:  March 19, 2003.   CHIEF COMPLAINT:  Left hip pain.   HISTORY OF PRESENT ILLNESS:  The patient is a 77 year old female who has  been evaluated by Dr. Ollen Gross for ongoing left hip pain.  The patient  was referred over by Dr. Lequita Halt by her daughter who is a Engineer, civil (consulting) over at  [employer].  The patient currently lives in [place], but wanted to come  to [place] and have her hip evaluated.  She currently lives with her son,  and her son's help requires some extended care giving.  She has been on anti-  inflammatories in the past for her hip arthritis.  Unfortunately, about a  month or so ago she developed some dark stools and emesis and developed  NSAID induced gastrointestinal bleed.  She underwent colonoscopy and an  abdominal ultrasound.  During the ultrasound, it was noted that she had  marked degeneration of the left hip, and they recommended that she follow up  with an orthopaedist.  She was taken off the NSAIDS.  She comes in and is  evaluated in the office.  Left hip films show complete obliteration of the  left hip joint with acetabular femoral head cystic changes and noted  moderate spurring.  It was also noted at that time the opposite hip on the  right had degenerative changes with near complete bone-on-bone and moderate  spurring.  Pain has been quite progressive and debilitating and limiting her  function.  It is felt she would best be served by undergoing a hip  replacement.  Risks and benefits of the procedure have been discussed  with  the patient at length by  Dr. Lequita Halt, and she elected to proceed with surgery.   ALLERGIES:  No known drug allergies.   CURRENT MEDICATIONS:  1. Alprazolam 0.5 mg one p.o. t.i.d. p.r.n.  2. Vicodin 7.5 mg p.r.n. pain.  3. Sucralfate 1 g one p.o. q.i.d.  4. Protonix 40 mg one p.o. q. day.  5. Benicar 40 mg p.o. q.d.  6. Furosemide 40 mg q. day.  7. Hydrochlorothiazide 25 mg q. day.  8. Plendil 5 mg p.o. q. day.  9. Hemocyte one p.o. q. day.   PAST MEDICAL HISTORY:  1. Hypertension.  2. History of NSAID induced gastrointestinal bleed.  3. Arthritis.   PAST SURGICAL HISTORY:  1. Colonoscopy x2.  2. Hysterectomy.   SOCIAL HISTORY:  Widowed.  Denies use of tobacco products or alcohol  products.  Has five children.  One of her daughters is a Engineer, civil (consulting) over at  [employer].   FAMILY HISTORY:  Mother living at age 37 with a history of hypertension and  bilateral knee replacements.  Father deceased secondary to a car accident,  age unknown.   REVIEW OF SYSTEMS:  GENERAL:  No fever, chills, night sweats.  NEUROLOGIC:  No seizures, syncope, or paralysis.  RESPIRATORY:  No shortness of breath,  productive cough, or hemoptysis.  CARDIOVASCULAR:  No chest pain, angina,  orthopnea.  GASTROINTESTINAL:  Recent history of a NSAID induced  gastrointestinal ulcer bleed.  She has not had any recent blood in the  stool, nausea, or vomiting.  GENITOURINARY:  No dysuria, hematuria, or  discharge.  MUSCULOSKELETAL:  Pertinent to that of the left hip found in the  history of present illness.   PHYSICAL EXAMINATION:  VITAL SIGNS:  Pulse 80, respirations 14, blood  pressure 138/74.  GENERAL:  The patient is a 77 year old African-American female, well-  developed, well-nourished, in no acute distress.  She is accompanied by her  daughter.  HEENT:  Normocephalic, atraumatic.  Pupils round and reactive.  Oropharynx  clear.  EOMs are intact.  NECK:  Supple, no carotid bruits.  CHEST:   Clear to auscultation anterior posterior chest walls.  No rhonchi,  rubs, or wheezing.  HEART:  Regular rhythm with occasional pause or a skipped beat, S1 and S2  are noted.  No rubs, thrills, or palpitations.  ABDOMEN:  Soft, nontender, bowel sounds present.  RECTAL:  Not done, not pertinent to present illness.  BREASTS:  Not done, not pertinent to present illness.  GENITALIA:  Not done, not pertinent to present illness.  EXTREMITIES:  The left hip shows pronounced decreased range of motion.  She  only has flexion of about 85 degrees, internal and external rotation of 10  and 20, respectively, and only abduction of about 20 degrees.  She does use  a walker for mobility and she ambulate with an antalgic gait.   IMPRESSION:  1. Marked end-stage osteoarthritis, left hip.  2. Advanced arthritis, right hip.  3. Hypertension.  4. History of non-steroidal anti-inflammatory drug induced gastrointestinal     ulcer/bleed.   PLAN:  The patient will be admitted to [facility] to undergo a  left total hip replacement arthroplasty.  Surgery will be performed by Dr.  Ollen Gross.      Alexzandrew L. Julien Girt, P.A.              Ollen Gross, M.D.    ALP/MEDQ  D:  03/22/2003  T:  03/22/2003  Job:  [id number]

## 2010-12-18 NOTE — Op Note (Signed)
NAME:  Tammy Sims, Tammy Sims                        ACCOUNT NO.:  [id number]   MEDICAL RECORD NO.:  [id number]                   PATIENT TYPE:  INP   LOCATION:  0480                                 FACILITY:  [facility]   PHYSICIAN:  Ollen Gross, M.D.                 DATE OF BIRTH:  [birth year]   DATE OF PROCEDURE:  03/25/2003  DATE OF DISCHARGE:                                 OPERATIVE REPORT   PREOPERATIVE DIAGNOSIS:  Osteoarthritis, left hip.   POSTOPERATIVE DIAGNOSIS:  Osteoarthritis, left hip.   OPERATION/PROCEDURE:  Left total hip arthroplasty.   SURGEON:  Ollen Gross, M.D.   ASSISTANT:  Oley Balm. Charlann Boxer, M.D.   ANESTHESIA:  General.   ESTIMATED BLOOD LOSS:  350 mL.   DRAINS:  Hemovac x1.   COMPLICATIONS:  None.   CONDITION:  Stable to recovery room.   BRIEF CLINICAL NOTE:  The patient is a 77 year old female with end-stage  arthritis of the left hip with pain refractory to nonoperative management.  She presents now for a left total hip arthroplasty.   DESCRIPTION OF PROCEDURE:  After the successful administration of general  anesthetic, the patient was placed in the right lateral decubitus position  with the left side up and held with the hip positioner.  Left lower  extremity was isolated from her perineum with plastic drapes and prepped and  draped in the usual sterile fashion.  A standard posterolateral incision was  made with a 10-blade through a very thick layer of subcutaneous tissue to  the level of the fascia lata which was incised in line with the skin  incision.  Sciatic nerve was palpated and protected and short rotators  isolated off the femur.  The capsulectomy was then performed.  The hip  dislocated in the center of the femoral head mark, and trial prosthesis is  placed such that the center of the trial head corresponds to the center of  her native femoral head.  Osteotomy line is marked on the femoral neck and  osteotomy is made with an oscillating  saw.   The femur is retracted anteriorly, partial capsulectomy performed anteriorly  and acetabulum exposure obtained.  Reaming starts at 45 mm, coursing to 55.  Then a 56 mm Pinnacle acetabular shell was placed in the anatomic position,  transfixed with dome screws.  Trial 32 mm neutral liner is placed.   The femoral preparation is initiated with a canal finder and irrigation.  Axial reaming is performed to 13.5 mm and proximal reaming up to an 18D and  a sleeve machine to a small.  An 18D small trial sleeve is placed and 18 x  13 stem with 36 +8 neck, matching her native version.  We put a 32 +0 head,  but with the extra offset, we could not adequately reduced her.  Thus we  went to a 36  standard neck with a 32 +0 head  and had reduced the hip with  adequate soft tissue tension and then she had excellent stability with full  extension and flexor rotations - 70 degrees of flexion and 40 degrees  adduction and 80 internal rotation and 90 degrees of flexion and 70 degrees  internal rotation.  The hip was dislocated, trials removed, and the  permanent apex hole eliminator into the acetabular shell.  A 32 mm neutral  Marathon liner was placed into the acetabular shell and then the 18-D small  sleeve placed in the proximal femur, 18 x 13 stem with a 36 standard neck  matching her native anteversion and then a 32 +0 head is placed.  Hip is  reduced with same stability parameters. The wounds copiously irrigated with  antibiotic solution and short rotators reattached to the femur through drill  holes.  Fascia lata was closed over a Hemovac drain with interrupted #1  Vicryl, subcu closed with #1 and 2-0 Vicryl, subcuticular running 4-0  Monocryl.  Incision was cleaned and dried and Steri-Strips and a bulky  sterile dressing applied. She was subsequently awakened and transported to  the recovery in stable condition.                                               Ollen Gross, M.D.    FA/MEDQ   D:  03/25/2003  T:  03/25/2003  Job:  [id number]

## 2010-12-18 NOTE — Discharge Summary (Signed)
NAME:  Tammy, Sims                        ACCOUNT NO.:  [id number]   MEDICAL RECORD NO.:  [id number]                   PATIENT TYPE:  INP   LOCATION:  A216                                 FACILITY:  [facility]   PHYSICIAN:  Elliot Cousin, M.D.                 DATE OF BIRTH:  12/06/1929   DATE OF ADMISSION:  01/22/2003  DATE OF DISCHARGE:  01/27/2003                                 DISCHARGE SUMMARY   DISCHARGE DIAGNOSES:  1. Acute gastritis with gastric ulcers x4, and one large deep crater of the     antrum with no active bleeding.  2. Acute duodenitis and duodenal ulcer.  3. Anemia secondary to #1 and #2, status post two units of packed red blood     cells.  4. Chronic left hip pain secondary to severe avascular necrosis of the left     hip.  5. Severe degenerative arthritic changes of the right hip.  6. Acute renal insufficiency.   SECONDARY DISCHARGE DIAGNOSES:  1. Rectal bleeding in November 2002.  Colonoscopy revealed:     A. Rectal polyp which was excised and found to be benign fragment of        chronically inflamed intestinal mucosa in November 2002.     B. Evidence of hemorrhoidal disease per colonoscopy in November 2002.  2. Chest pain in March 2004, with:     A. Two-dimensional echocardiogram revealing normal left and right        ventricular systolic function and no wall motion abnormalities per 2-D        echo October 18, 2002.     B. Adenosine Cardiolite study on October 18, 2002, revealed an ejection        fraction of 70% with no evidence of focal wall motion abnormalities.        The results revealed a study that was technically limited because the        patient had only the stress part of the study; however, there was no        significant perfusion defect.  She did have downsloping of the ST        depressions in leads II, III, and aVF and lateral leads of V4 through        V6.  3. Hypertension.  4. Chronic anxiety.  5. Status post total abdominal hysterectomy  in the past and a history of     unilateral salpingo-oophorectomy secondary to fibroids and dysfunctional     uterine bleeding.   DISCHARGE MEDICATIONS:  1. Hemocyte 1 daily.  2. Hydrochlorothiazide 25 mg, 1 daily.  3. Vicodin 5/500, 1 three times daily p.r.n. pain.  4. Benicar 40 mg daily.  5. Plendil 5 mg daily.  6. Xanax 0.5 mg three times daily p.r.n.  7. Protonix 40 mg daily.  8. Carafate 1 g q.i.d. p.o.   DISCHARGE DISPOSITION:  Tammy Sims was discharged  to home in improved and  stable condition.  She has a follow-up appointment with her primary care  physician, Dr. Katrinka Blazing, in two weeks.   CONSULTATIONS:  1. Dr. Elpidio Anis.  2. Dr. Darreld Mclean.   PROCEDURES PERFORMED:  1. Acute abdominal series.  The results revealed a normal gas pattern.  No     free peritoneal air.  No active chest disease.  Severe arthritic changes     associated with both hips, with the left hip showing complete loss of     normal joint space and subchondral cystic change and changes consistent     with avascular necrosis of the left hip.  2. Ultrasound of the abdomen.  The ultrasound was within normal limits.  3. EGD per Dr. Katrinka Blazing on January 25, 2003.  The results revealed acute gastritis     in the antrum, body of the stomach, fundus, and cardia of the stomach,     edematous, erosive, erythematous, friable, and thickened as well as     ulcerated.  4. Small ulcers and one large deep crater in the midportion of the antrum.     The large crater had a healing fibrous base with no recent hemorrhage.     There was evidence of duodenitis of the duodenal bulb, and a duodenal     ulcer was also seen.  5. Total colonoscopy.  The colonoscopy was within normal limits.   HISTORY OF PRESENT ILLNESS:  Tammy Sims is a 77 year old African-American  woman with a past medical history significant for rectal bleeding secondary  to a rectal polyp in November 2002, hypertension, chronic left hip pain, and  chronic NSAID  use, who presented to the emergency department on January 22, 2003, with a three-day history of nausea and a 24-hour history of vomiting.  The patient also state that she has had intermittent diffuse abdominal pain  that starts in the lower abdomen but radiated to the epigastrium and the  right upper quadrant.  She also stated that she had some brown-colored  sputum in her vomitus a few times when she threw up.  Her bowel movements  have been daily; however, she said that over the past three to four days  prior to admission she noticed that her stools became black-colored.  She  had no bright red blood per rectum.  The patient has been taking Indocin for  chronic left hip pain for several years now.   HOSPITAL COURSE:  #1 - ABDOMINAL PAIN, NAUSEA, VOMITING, AND MELENA  SECONDARY TO PEPTIC ULCER DISEASE (ACUTE GASTRITIS WITH ULCERS IN THE ANTRUM  AND ACUTE DUODENITIS AND DUODENAL ULCER):  On initial exam the patient's  abdomen was soft and diffusely and mildly tender but more tender in the  epigastric region.  The rectal exam, as reported from the emergency  department physician, revealed a guaiac-positive black-colored stool.  The  patient's blood work initially was significant for a hemoglobin of 9.9 and  hematocrit of 29.8 with an MCV of 86.8.  The BUN was also elevated at 29,  and creatinine was elevated at 2.1.  The patient's liver function test was  within normal limits.  The initial management included admitting the patient  to a telemetry bed, obtaining an ultrasound of the abdomen, of IV fluids  with normal saline with potassium chloride added at 100 mL/hr after the  patient received a 1-L bolus of normal saline in the emergency department,  obtaining a repeat hemoglobin and hematocrit and following  the patient's  blood counts, typing and crossing two units of packed red blood cells,  discontinuing all NSAIDs, and obtaining iron studies.  The patient was initially placed on a  clear-liquid diet and was treated symptomatically with  Phenergan IV every six hours as needed and Protonix 40 mg IV every day  empirically.  Dr. Katrinka Blazing was also consulted for a diagnostic EGD and  colonoscopy.   The following morning after volume repletion the patient's hemoglobin fell  to 8.2, and the hematocrit had fallen to 24.6.  The patient had no grossly  bloody stools overnight during hospital day #1.  She was transfused the two  units that had been previously typed and crossed.  The nausea and vomiting  had subsided on hospital day #2.  She began to feel symptomatically better  on the second hospital day.  The ultrasound of the abdomen which was ordered  was completely within normal limits.  Dr. Katrinka Blazing provided the evaluation via  the EGD and colonoscopy.  The EGD revealed acute gastritis with four small  ulcers and one deep ulcer crater in the antrum but no active bleeding.  The  EGD also revealed acute duodenitis and a duodenal ulcer with no active  bleeding.  An H. pylori serology was supposed to be ordered but, apparently,  this was an oversite and was not ordered.  The colonoscopy was completely  normal.  The patient was, therefore, continued on Protonix 40 mg daily, and  Carafate 1 g q.i.d. was added to the regimen.  The patient's hemoglobin and  hematocrit stabilized during the hospital course after she was transfused  two units of packed red blood cells.  The patient's hemoglobin prior to  hospital discharge improved to 10.5, and the hematocrit improved to 31.5.  Additional iron studies revealed a total reticulocyte count of 44.7 and a  percentage of 1.3, total iron of 44, TIBC of 252, percent saturation of 17  (which was decreased), a B12 of 214, an RBC folate of 452, and a ferritin of  72.  The patient was discharged to home on Hemocyte supplementation once a  day.  The patient was admonished to discontinue all NSAID products.  She was  treated with hydrocodone and will be  treated with hydrocodone in the  outpatient setting for her left hip pain.  An H. pylori serology will need  to be obtained in the outpatient setting.   #2 - ACUTE RENAL INSUFFICIENCY:  The patient's initial creatinine was 2.1  and BUN was 29 on admission.  The patient had no previous history of chronic  renal insufficiency.  After she was treated with volume repletion with  normal saline, her creatinine improved to 1, and the BUN improved to 9  before hospital discharge.   #3 - CHRONIC LEFT HIP PAIN SECONDARY TO SEVERE AVASCULAR NECROSIS:  On  initial exam the patient was very symptomatic with regards to left hip pain.  On exam she had pain with any attempt of manipulation of her left hip in any  direction.  The patient could only flex and extend her hip no more than 5-10  degrees because of the pain.  The acute abdominal series picked up the  patient's hips and revealed severe avascular necrosis of the left hip and severe arthritic changes of the right hip although the patient was  asymptomatic of her right hip.  Given these findings, Dr. Hilda Lias,  orthopedic physician, was consulted.  He assessed the patient and felt that  the patient would need to have a total hip arthroplasty; however, this would  be done as an outpatient.  The patient was in agreement that the left hip  surgery needed to be rescheduled rather than having it performed during this  hospitalization.  The patient was treated with Darvocet initially as needed  for pain; however, this was changed to Vicodin every six hours as needed for  pain.  The patient did receive some pain relief when she was able to take  the Vicodin.                                               Elliot Cousin, M.D.    DF/MEDQ  D:  01/31/2003  T:  01/31/2003  Job:  [id number]

## 2010-12-18 NOTE — H&P (Signed)
NAME:  Tammy Sims, Tammy Sims                        ACCOUNT NO.:  [id number]   MEDICAL RECORD NO.:  [id number]                   PATIENT TYPE:  INP   LOCATION:  A216                                 FACILITY:  [facility]   PHYSICIAN:  Elliot Cousin, M.D.                 DATE OF BIRTH:  12/27/1929   DATE OF ADMISSION:  01/22/2003  DATE OF DISCHARGE:                                HISTORY & PHYSICAL   CHIEF COMPLAINT:  Nausea, vomiting, abdominal pain and black stools.   HISTORY OF PRESENT ILLNESS:  Tammy Sims is a 77 year old African American  woman with a past medical history significant for rectal bleeding secondary  to a rectal polyp and hemorrhoids in November of 2002, hypertension, and  chronic left hip pain, who presented to the emergency department tonight  with a 3-day history of nausea and a 24-hour history of vomiting.  The  patient also states that she has been having intermittent diffuse abdominal  pain that starts in the lower abdomen but radiates to the epigastrium and  the right upper quadrant.  The pain is gas-like and it is accompanied by  gurgling sounds in her stomach.  Her appetite has been poor over the past  three to four days.  She says that she did have some brown-colored sputum  and vomitus a few times when she has thrown up.  She did not classify this  consistency as coffee-grounds.  She denies any constipation and she has had  no diarrhea.  Her bowel movements have been daily, however, over the past  three to four days, she has noticed black stools.  She denies any bright red  blood per rectum.  She denies any painful urination.  She denies any  difficulty swallowing.  She has had no recent heartburn, however, she has  had gas-like sensation that radiates up to her chest and she has also had  some mild abdominal bloating.  She has also had accompanying subjective  fever and chills.  Her last bowel movement was early this morning.   The patient has been taking Indocin  for the past several years secondary to  chronic left hip pain.  She takes Indocin 75 mg twice daily.  She denies any  alcohol use.  She denies any additional NSAIDs, over the counter or  prescribed.   Her review of systems is positive for fatigue, malaise, poor appetite, small  amount of bleeding from her left nostril when blowing her nose, chronic left  hip and leg pain, and occasional swelling in her legs.  She also sometimes  has shortness of breath with activity and also when she becomes anxious.   Her review of systems is negative for headache, weight loss, visual changes,  difficulty swallowing, painful swallowing, orthopnea, PND, dysuria,  hematuria.   When the patient was evaluated in the emergency department, she was found to  have a guaiac-positive stool on  exam per the emergency room physician and  the R.N.  Her stool was black and tarry per the R.N. report.  Her hemoglobin  was found to be 9.9 and her hematocrit was found to be 29.8.  The patient  will therefore be admitted for evaluation and management of melena and  anemia thought to be secondary to probable upper GI bleed, which is probably  subacute.   PAST MEDICAL HISTORY:  1. Rectal bleeding in November of 2002.     a. Colonoscopy, June 08, 2003, revealed a rectal polyp which was        excised and found to be a benign fragment of chronically inflamed        intestinal mucosa.     b. Evidence of hemorrhoidal disease.  2. Chest pain in March of 2004.     a. Two-dimensional echocardiogram, October 18, 2002, revealed mild left        atrial enlargement; otherwise, the left ventricular function as well        as the right ventricular systolic function were well within normal        limits.  There were no wall motion abnormalities.     b. Adenosine Cardiolite study on October 18, 2002.  The results revealed a        study that was technically limited because the patient had only the        stress part of the study,  however, there was no significant perfusion        defect; equivocal adenosine study with downsloping of the ST        depressions of 1 to 1.2 mm in leads II, III and aVF and lateral leads        of V4 to V6; normal left ventricular ejection fraction of 70% with no        evidence of focal wall motion abnormalities.  3. Chronic anemia.  4. Chronic left hip and leg pain.  5. Chronic Indocin use secondary to chronic left hip and leg pain.  6. Hypertension.  7. Chronic anxiety.  8. Status post total abdominal hysterectomy in the past and history of     unilateral salpingo-oophorectomy secondary to fibroids and dysfunctional     uterine bleeding.   MEDICATIONS:  1. Indocin SR 75 mg b.i.d.  2. Hydrocodone/APAP 5/500 mg t.i.d. p.r.n. pain.  3. Ultracet one twice daily p.r.n. pain.  4. Benicar 40 mg daily.  5. Hydrochlorothiazide 25 mg daily.  6. Norvasc 10 mg daily.  7. Alprazolam 0.5 mg t.i.d. p.r.n. for nervousness.  8. Furosemide 40 mg daily.   ALLERGIES:  No known drug allergies.   FAMILY HISTORY:  The patient's mother is 66 years old and has a history of  peptic ulcer disease, hypertension and arthritis.  Her father is deceased,  age unknown, secondary to a motor vehicle accident.  The patient does have  one son who has Down's syndrome; he is 88 years old.   SOCIAL HISTORY:  The patient is widowed.  [place]  Her 91 year old son lives with her.  She has five adult children in all.  She completed the sixth grade in education.  She can read and write a  little.  She denies any tobacco, alcohol and drug use.   PHYSICAL EXAMINATION:  VITAL SIGNS:  Vital signs in the emergency department  revealed a temperature of 99, a blood pressure of 112/77, pulse of 96 and a  respiratory  rate of 24, oxygen saturation of 100%.  Vital signs on the  floor:  Temperature 96.2, pulse 77, respiratory rate 20, blood pressure  173/79.  Weight 167.9 pounds. GENERAL:  The  patient is an elderly, slightly overweight 77 year old [place]  American woman who is currently lying in bed, somewhat anxious, but in no  acute distress.  HEENT:  Head is normocephalic and non-traumatic.  Pupils are equal, round  and reactive to light.  Extraocular movements are intact.  Conjunctivae are  clear.  Sclerae are white.  No ptosis or proptosis noted.  Oropharynx  reveals fair dentition.  Mucous membranes are moist.  No posterior exudates  or erythema.  NECK:  Neck is supple without any thyromegaly or adenopathy.  No JVD.  LUNGS:  Her lungs are clear to auscultation bilaterally.  HEART:  S1 and S2 with no murmurs, rubs, or gallops.  BREASTS:  Breasts bilaterally are soft with no masses palpated.  ABDOMEN:  There is a well-healed vertical hypogastric scar.  The patient's  bowel sounds are present.  Her abdomen is soft and mildly diffusely tender  but a little more tender in the epigastrium.  RECTAL:  Exam was deferred because the emergency department R.N. performed a  rectal exam which was guaiac-positive of black stool.  GU:  Deferred.  EXTREMITIES/MUSCULOSKELETAL:  The patient has a trace of bilateral pedal  edema.  Her pedal pulses are barely palpable bilaterally.  She has very  limited range of motion of her left hip with flexion and extension secondary  to pain.  She was able to flex and extend her hip only 5 to 10 degrees.  The  patient does have some mild-to-moderate tenderness over the left greater  trochanteric bursa.  The right hip and leg range of motion was well within  normal limits in all directions.  She has no tenderness over her right hip.  There was no muscle atrophy seen.  No other acute or chronic joint  abnormalities examined.  NEUROLOGIC:  The patient is alert and oriented x3.  Cranial nerves II-XII  are grossly intact.  Strength is 5/5 throughout with the exception of the  left lower extremity.  The strength of the left lower extremity was   approximately 4-/5 with proximal and distal flexion and extension.  The  weakness was thought to be secondary to pain.  PSYCHOLOGICAL:  The patient is alert and oriented x3.  She is somewhat  anxious.   ADMISSION LABORATORY DATA:  Acute abdominal series:  As reported by Dr.  Alvira Philips, emergency department physician, the acute abdominal series  revealed no acute abnormalities.   WBC 4.9, hemoglobin 9.9, hematocrit 29.8, MCV 86.8, platelets 205,000.  PT  13.6, INR 1.0, PTT 30.  Sodium 137, potassium 3.9, chloride 105, CO2 25,  glucose 107, BUN 29, creatinine 2.1, calcium 10.0, total bilirubin 0.4,  direct bilirubin 0.1, indirect bilirubin 0.3, alkaline phosphatase 87, SGOT  39, SGPT 20, total protein 7.3, albumin 3.8.  Urinalysis:  Urine blood  trace, urine ketones trace, otherwise, urinalysis is within normal limits.   ASSESSMENT:  1. Abdominal pain, nausea, vomiting and melena.  Given the patient's history     of nonsteroidal anti-inflammatory drug use, peptic ulcer disease and    acute gastritis are at the top of the differential diagnoses.  We will     also consider esophagitis, esophageal ulcer, and cholecystitis, as well     as biliary colic.  2. Normocytic anemia, probably secondary to #1.  The patient's baseline     hemoglobin and hematocrit are unknown at this time.  3. Renal insufficiency.  The patient's BUN is 29 and creatinine is 2.1.  I     am unaware of her baseline renal function.  It is possible that the     patient has prerenal azotemia, given her history of poor appetite and     nausea and vomiting.  4. Acute-on-chronic left hip pain.  The patient was told by her primary care     physician, Dr. Katrinka Blazing, that she had arthritis in her left hip.  The     patient appears to be symptomatic on exam.  The patient states that she     has had an MRI in the past but I cannot find any record of this.   PLAN:  1. Will admit the patient to a telemetry bed.  2. Obtain a  baseline EKG.  3. Obtain an ultrasound of the abdomen in the morning for further     evaluation.  4. The patient was given 1 L bolus of normal saline in the emergency     department.  We will continue volume repletion with normal saline with 20     mEq of potassium chloride added at 100 mL/hr.  5. We will monitor the patient's CBC and recheck the hemoglobin and     hematocrit in the a.m.  6. Type and cross two units of packed red blood cells and hold for now.  7. Discontinue indomethacin and continue pain-relief medications with     hydrocodone and Ultracet as needed.  8. We will obtain iron studies.  9. We will place the patient on a clear liquid diet and treat     symptomatically with Phenergan IV every six hours as needed.  10.      Consult Dr. Katrinka Blazing for a diagnostic EGD and colonoscopy.  11.     Protonix 40 mg IV q.24h.  12.      Strict I's and O's and daily weights.  13.      Follow renal function.                                               Elliot Cousin, M.D.    DF/MEDQ  D:  01/23/2003  T:  01/23/2003  Job:  [id number]

## 2010-12-18 NOTE — Procedures (Signed)
   NAME:  Tammy Sims, Tammy Sims NO.:  [id number]   MEDICAL RECORD NO.:  [id number]                   PATIENT TYPE:   LOCATION:                                       FACILITY:   PHYSICIAN:  [place] Bing, M.D. [facility]           DATE OF BIRTH:   DATE OF PROCEDURE:  10/18/2002                  AGE:  DATE OF DISCHARGE:                              SEX:                                CARDIAC ULTRASOUND   CLINICAL INFORMATION:  A 77 year old woman with chest pain and hypertension.   M-MODE:  AORTA:  2.8  (<4.0)  LEFT ATRIUM:  3.9  (<4.0)  SEPTUM:  1.2  (0.7-1.1)  POSTERIOR WALL:  0.9  (0.7-1.1)  LV-DIASTOLE:  4.0  (<5.7)  LV-SYSTOLE:  2.9  (<4.0)   FINDINGS/IMPRESSION:  1. A technically adequate echocardiographic study.  2. Mild left atrial enlargement; normal right atrium and right ventricle.  3. Normal aortic, mitral, tricuspid, and pulmonic valves.  4. Doppler study demonstrated very mild mitral and tricuspid regurgitation;     estimated RV systolic pressure is at the upper limit of normal.  5. Normal internal dimension, wall thickness, regional and global function     of the left ventricle.  6. Normal IVC.                                               [name], M.D. [facility]    RR/MEDQ  D:  10/21/2002  T:  10/22/2002  Job:  [id number]

## 2010-12-18 NOTE — Discharge Summary (Signed)
NAME:  Tammy Sims, Tammy Sims                        ACCOUNT NO.:  [id number]   MEDICAL RECORD NO.:  [id number]                   PATIENT TYPE:  IPS   LOCATION:  4140                                 FACILITY:  [facility]   PHYSICIAN:  Ellwood Dense, M.D.                DATE OF BIRTH:  Nov 07, 1929   DATE OF ADMISSION:  03/29/2003  DATE OF DISCHARGE:  04/05/2003                                 DISCHARGE SUMMARY   DISCHARGE DIAGNOSES:  1. Left total hip arthroplasty.  2. Deep venous thrombosis prophylaxis.  3. History of hypertension.  4. History of anxiety.  5. History of anemia.  6. Gastrointestinal prophylaxis/gastroesophageal reflux disease.   HISTORY OF PRESENT ILLNESS:  The patient is a 77 year old white female with  past medical history of anxiety, hypertension, and left hip pain who elected  to undergo a left total hip arthroplasty on March 25, 2003, by Dr. Lequita Halt.  The patient is presently on Coumadin for DVT prophylaxis.  Postoperative  complications included anemia status post transfusion.  PT report at this  time indicates the patient is touchdown weightbearing, ambulating minimal to  moderate assist at approximately 250 feet with rolling walker and  transferring moderate assist.  The patient is transferred to [facility] Department on March 29, 2003.   PRIMARY CARE PHYSICIAN:  Annia Friendly. Hill, M.D.   MEDICATIONS PRIOR TO ADMISSION:  1. Lasix 40 mg daily p.r.n.  2. Hydrochlorothiazide 25 mg p.o. daily.  3. Vicodin p.r.n.  4. Benicar 40 mg daily.  5. Plendil 5 mg daily.  6. Hemocyte 1 mg daily.  7. Xanax p.r.n.   ALLERGIES:  No known drug allergies.   PAST MEDICAL HISTORY:  1. Hypertension.  2. PUD secondary to NSAIDs.  3. Osteoarthritis.   PAST SURGICAL HISTORY:  1. Hysterectomy.  2. Colostomy x2.   FAMILY HISTORY:  Noncontributory.   SOCIAL HISTORY:  The patient lives with son in one level home in [place], [place].  She was independent prior to  admission.  There are two steps to  entry.  She has a ramp and she is a widow.  She has five children, a  supportive family.   REVIEW OF SYMPTOMS:  Significant for joint pain and joint swelling.  No  chest pain.   HOSPITAL COURSE:  Tammy Sims was admitted to [facility]  Department on March 29, 2003, for comprehensive inpatient rehabilitation  where she received more than three hours of therapy daily.  Overall, [name] has made great progress during her seven-day stay in rehab.  She was  discharged at a modified independent level.  The patient was able to  tolerate therapies and adhere to total hip precautions.  She remained on  Coumadin for DVT prophylaxis without any bleeding noted.  Her pain was under  great control with oxycodone.   Hospital course was significant for drop in blood  pressure.  Blood pressure  medications were adjusted as needed.  The patient was placed on Trinsicon  p.o. b.i.d. for hemoglobin of 9.6 at the time of admission.  There were no  other major issues while the patient was in rehab.  She remained on Protonix  40 mg daily for GI prophylaxis.   Latest labs revealed latest hemoglobin 10.1, hematocrit 30.4, white blood  cell count 6.5, platelet count 334.  She had one Hemoccult performed on  stool which was negative.  Latest INR was 2.9.  Latest sodium 134, potassium  3.5, chloride 96, CO2 31, glucose 97, BUN 19, creatinine 1.0. AST 20, ALT  13.  Urine culture performed on March 29, 2003, with 75,000 colonies.   PT report indicates the patient is ambulating greater than 100 feet modified  independent level and could transfer sit to stand modified independent  level.  Surgical incision demonstrates no signs of infection.  The patient  was discharged home with family.   DISCHARGE MEDICATIONS:  1. Hydrochlorothiazide one tablet daily.  2. Plendil 5 mg daily.  3. Protonix 40 mg daily.  4. Hemocyte one tablet daily, resume.  5. Sucralfate,  resume.  6. Coumadin 2.5 mg Monday, Wednesday, Friday and 5 mg on Tuesday, Thursday,     Saturday and Sunday until April 25, 2003.  7. Oxycodone p.r.n.  8. Resume Xanax and Lasix as needed.  9. No aspirin, ibuprofen or Aleve while on Coumadin.   PAIN MANAGEMENT:  Oxycodone and Tylenol.   ACTIVITY:  Observe hip precautions.  Use walker.  No driving.  No drinking  alcohol.  Maintain touchdown weightbearing status.  She is to have [facility] for PT, OT, and RN.  First draw will be Monday April 08, 2003.    FOLLOW UP:  She will follow up with Dr. Lequita Halt at the end of next week.  See Dr. Loleta Chance as her primary care physician.  She will call for appointment  in four to six weeks.  Monitor anemia.  Follow-up with Dr. Ellwood Dense as  needed.      Junie Bame, P.A.                       Ellwood Dense, M.D.    LH/MEDQ  D:  04/23/2003  T:  04/24/2003  Job:  [id number]   cc:   Ollen Gross, M.D.  [address]  [place]  [place] [address]  Fax: [phone]   Annia Friendly. Loleta Chance, M.D.  [address]  [place]  [place] [address]  Fax: [phone]

## 2010-12-18 NOTE — Consult Note (Signed)
   NAME:  Tammy Sims, Tammy Sims                        ACCOUNT NO.:  [id number]   MEDICAL RECORD NO.:  [id number]                   PATIENT TYPE:  INP   LOCATION:  A216                                 FACILITY:  [facility]   PHYSICIAN:  J. Darreld Mclean, M.D.              DATE OF BIRTH:  09-Jul-1930   DATE OF CONSULTATION:  DATE OF DISCHARGE:                                   CONSULTATION   REQUESTING PHYSICIAN:  Consultation requested by Dr. Sherrie Mustache.   CHIEF COMPLAINT:  Hip pain on the left.   The patient had significant pain and tenderness in the left hip, says that  it has been bothering her for over a year.  Her husband died in 11/28/2022 of  last year and says that it was bothering her right before then, that she  noticed, and it has gotten progressively worse.  She denies any falls,  trauma, or injury.  She is having great difficulty in ambulating; she is now  using a cane.  She was here for an abdominal series, and abdominal series  shows severe avascular necrosis of the left hip involving both sides of the  joint with some collapse and marked cyst formation.  Range of motion of the  hip is markedly limited.  There is very little internal and external  rotation.  She can flex to about 90, but painfully.  Leg lengths appear to  be equal.  There is no effusion to the knee.  She complains of some knee  pain at times, radiating.   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   The patient is going to need a total hip arthroplasty; however, she is  having endoscopy today and colonoscopy in a workup for loose stool, low  hemoglobin and blood in her stools.  After workup is done, and after  discharge I will see her in the office and then we can discuss the  possibility of a hip replacement.  I recommended a walker in the interim and  have physical therapy see her and begin ambulation with a walker.   She is taking Indocin now for an antiinflammatory; told her to continue with  this, or  we could change her to a COX 2 if desired; it depends upon what the  gastroenterologist finds on workup and what he may recommend or not  recommend.  Will follow.                                               Teola Bradley, M.D.    JWK/MEDQ  D:  01/24/2003  T:  01/24/2003  Job:  [id number]

## 2010-12-18 NOTE — Op Note (Signed)
   NAME:  Tammy Sims, Tammy Sims                        ACCOUNT NO.:  [id number]   MEDICAL RECORD NO.:  [id number]                   PATIENT TYPE:   LOCATION:                                       FACILITY:  [facility]   PHYSICIAN:  Dirk Dress. Katrinka Blazing, M.D.                DATE OF BIRTH:  [birth year]   DATE OF PROCEDURE:  DATE OF DISCHARGE:                                 OPERATIVE REPORT   PREOPERATIVE DIAGNOSIS:  Gastrointestinal bleeding with melena, history of  colonic polyps.   POSTOPERATIVE DIAGNOSIS:  Gastrointestinal bleeding with melena, history of  colonic polyps; no abnormality found.   PROCEDURE:  Colonoscopy.   SURGEON:  Dirk Dress. Katrinka Blazing, M.D.   DESCRIPTION OF PROCEDURE:  After completion of the upper endoscopy the  patient was turned and positioned.  She was still mildly awake so she was  given 50 mg of Demerol and 12.5 mg of Phenergan.  The colonoscope was  introduced into the cecum without difficulty.  The cecum was identified by  the confluence of the tinea and the intravenous.  The scope was slowly  withdrawn and no abnormality was found.  There was no evidence of polyps.  There was no evidence of diverticulosis.  The patient tolerated the  procedure well.  At the end of the procedure the patient was significantly  sedate and drowsy. She was given half an amp Romazicon for reversal of the  Versed, but no Narcan was given.  Blood pressure then was 90/60 and oxygen  saturation was 100% and her heart rate was 68.  The patient tolerated both  procedures well.                                               Dirk Dress. Katrinka Blazing, M.D.    LCS/MEDQ  D:  01/25/2003  T:  01/25/2003  Job:  [id number]

## 2010-12-18 NOTE — Discharge Summary (Signed)
NAME:  Tammy Sims, Tammy Sims                        ACCOUNT NO.:  [id number]   MEDICAL RECORD NO.:  [id number]                   PATIENT TYPE:  INP   LOCATION:  0480                                 FACILITY:  [facility]   PHYSICIAN:  Ollen Gross, M.D.                 DATE OF BIRTH:  1930/06/14   DATE OF ADMISSION:  03/25/2003  DATE OF DISCHARGE:  03/29/2003                                 DISCHARGE SUMMARY   ADMISSION DIAGNOSES:  1. Marked end-stage osteoarthritis left hip.  2. Advanced osteoarthritis right hip.  3. Hypertension.  4. History of nonsteroidal anti-inflammatory drug-induced gastrointestinal     ulcer/bleed.   DISCHARGE DIAGNOSES:  1. Osteoarthritis left hip status post left total hip replacement     arthroplasty.  2. Postoperative blood loss anemia.  3. Status post transfusion without sequelae.  4. Advanced osteoarthritis right hip.  5. Hypertension.  6. History of nonsteroidal anti-inflammatory drug-induced gastrointestinal     ulcer/bleed.   PROCEDURE:  On March 25, 2003 left total hip arthroplasty.   SURGEON:  Ollen Gross, M.D.   ASSISTANT:  Madlyn Frankel. Charlann Boxer, M.D.   ANESTHESIA:  General.   ESTIMATED BLOOD LOSS:  Three-hundred and 50 mL.   DRAINS:  Hemovac drain x1.   CONSULTATIONS:  Rehabilitation services, Ranelle Oyster, M.D.   BRIEF HISTORY:  The patient is a 77 year old female evaluated by Ollen Gross, M.D. for ongoing left hip pain.  She was referred over by her  daughter who is a Engineer, civil (consulting) at [employer].  She currently lives in [place]  but wanted to come to [place] and have her hip evaluated and she  unfortunately developed some dark stools and emesis secondary to an NSAID-  induced gastrointestinal bleed ulcer.  She underwent colonoscopy and  abdominal ultrasound.  Found on the ultrasound was marked degeneration of  the left hip.  She followed up with an orthopedist who felt that she was a  candidate for total hip replacement.  She had  near complete bone-on-bone  with moderate spurring.  She was seen by Ollen Gross, M.D. and felt like  she would benefit from the surgery.  Risks and benefits discussed.  The  patient elected to proceed with surgery.   LABORATORY DATA:  CBC on admission, hemoglobin 10.2, hematocrit 30.6, white  cell count 4.7, red cell count 3.47.  Differential within normal limits.  Postop H&H 7.8 and 23.3.  Given blood.  Posttransfusion hemoglobin 9.7.  Last noted H&H 10 and 29.4.  PT/PTT on admission were 13 and 34 respectively  with an INR of 1.  Serial pro times followed.  Last noted PT/INR 18.5 and  1.1.  Chemistry panel on admission all within normal limits.  Serial BMETs  were followed.  Glucose went up from 93 to 142 back down to 139.  Calcium  dropped from 9.3 to 8.2.  Urinalysis on admission trace leukocyte esterase,  0-2  white cells, otherwise negative.  Blood group type B negative.   HOSPITAL COURSE:  The patient was admitted to [facility], taken to  the OR and underwent the above-cited procedure without complications.  The  patient tolerated the procedure well and later returned to recovery room and  then to the orthopedic floor for continued postop care.  Vital signs were  followed.  The patient was given 24 hours of postop IV antibiotics in the  form of Ancef, given Coumadin for three weeks, placed partial weightbearing.  Labs were followed.  Hemovac drain placed at time of surgery was pulled on  postop #1.  She did have a drop down in hemoglobin from a starting level of  10.2 down to 7.8.  She was given blood.  Tolerated the blood quite well.  Hemoglobin responded well to 9.7.  She was seen in consultation by rehab  services, Ranelle Oyster, M.D., who felt she would be an appropriate  candidate for rehab.  It was decided the patient would be transferred after  which time a bed became available.  The plan was possibly going to rehab or  [facility].  There were no beds available  initially before an FL-2 was sent out  for other options.  She did continue to receive physical therapy and  occupational therapy while she was on the floor at [facility].  She had initially slowed progressive physical therapy, up and ambulating  approximately 5 feet by day two.  Due to the slow progress, it was felt she  would be appropriate candidate for rehab.  Incision was healing well.  By  day #4, she had been weaned over to her p.o. meds, PCA had been  discontinued, Foley was out, IV's were out after the blood.  It was noted  later that day a bed became available in the rehab unit.  The patient was in  agreement.  It decided the patient would be transferred at that time.   DISCHARGE PLAN:  1. The patient transferred to [facility] 29, 2003.  2. Discharge diagnoses:  Please see above.  3. Discharge meds:  The patient is on Percocet for pain, Robaxin for spasm.     She is to continue her home meds.  MAR is to be sent over with the chart.  4. Diet:  A low sodium diet.  5. Activity:  She is partial weightbearing to the left lower extremity.     Continue with gait training ambulation, ADLs per PT and OT while on the     rehab service.  Daily dressing changes.  May start showering postop day     #4.  6. Followup two weeks from surgery or following the discharge from the     subacute unit.   DISPOSITION:  [facility].   CONDITION ON DISCHARGE:  Improved.     Alexzandrew L. Julien Girt, P.A.              Ollen Gross, M.D.    ALP/MEDQ  D:  05/03/2003  T:  05/04/2003  Job:  [id number]

## 2011-01-11 ENCOUNTER — Encounter (HOSPITAL_COMMUNITY): Payer: Medicare Other | Attending: Oncology | Admitting: Oncology

## 2011-01-11 DIAGNOSIS — Z79899 Other long term (current) drug therapy: Secondary | ICD-10-CM | POA: Insufficient documentation

## 2011-01-11 DIAGNOSIS — D61818 Other pancytopenia: Secondary | ICD-10-CM | POA: Insufficient documentation

## 2011-01-11 DIAGNOSIS — E039 Hypothyroidism, unspecified: Secondary | ICD-10-CM | POA: Insufficient documentation

## 2011-01-11 DIAGNOSIS — I10 Essential (primary) hypertension: Secondary | ICD-10-CM | POA: Insufficient documentation

## 2011-04-22 LAB — COMPREHENSIVE METABOLIC PANEL
Albumin: 3.6
BUN: 16
Creatinine, Ser: 1.15
GFR calc Af Amer: 56 — ABNORMAL LOW
Potassium: 4.1
Total Protein: 7.5

## 2011-04-22 LAB — CBC
HCT: 33.9 — ABNORMAL LOW
MCV: 89.8
Platelets: 143 — ABNORMAL LOW
RDW: 13.3

## 2011-04-22 LAB — DIFFERENTIAL
Lymphocytes Relative: 46
Monocytes Absolute: 0.3
Monocytes Relative: 7
Neutro Abs: 1.6 — ABNORMAL LOW

## 2011-04-22 LAB — LIPID PANEL
HDL: 68
Total CHOL/HDL Ratio: 3
Triglycerides: 71
VLDL: 14

## 2011-04-28 LAB — COMPREHENSIVE METABOLIC PANEL
ALT: 16
Alkaline Phosphatase: 82
BUN: 17
CO2: 31
Calcium: 9.7
GFR calc non Af Amer: 43 — ABNORMAL LOW
Glucose, Bld: 103 — ABNORMAL HIGH
Sodium: 140
Total Protein: 7.7

## 2011-04-28 LAB — CBC
HCT: 33.4 — ABNORMAL LOW
Hemoglobin: 11.5 — ABNORMAL LOW
MCHC: 34.3
MCV: 91.7
RBC: 3.64 — ABNORMAL LOW
RDW: 13.1

## 2011-04-28 LAB — TSH: TSH: 3.664

## 2011-04-28 LAB — DIFFERENTIAL
Basophils Relative: 0
Eosinophils Absolute: 0.1
Eosinophils Relative: 2
Monocytes Absolute: 0.3
Neutro Abs: 1.7
Neutrophils Relative %: 44

## 2011-04-28 LAB — T4, FREE: Free T4: 1.24

## 2011-04-28 LAB — LIPID PANEL: VLDL: 10

## 2011-05-01 ENCOUNTER — Emergency Department (HOSPITAL_COMMUNITY): Payer: Medicare Other

## 2011-05-01 ENCOUNTER — Emergency Department (HOSPITAL_COMMUNITY)
Admission: EM | Admit: 2011-05-01 | Discharge: 2011-05-01 | Disposition: A | Discharge status: Home / Self Care | Payer: Medicare Other | Attending: Emergency Medicine | Admitting: Emergency Medicine

## 2011-05-01 ENCOUNTER — Encounter (HOSPITAL_COMMUNITY): Payer: Self-pay | Admitting: *Deleted

## 2011-05-01 DIAGNOSIS — R112 Nausea with vomiting, unspecified: Secondary | ICD-10-CM | POA: Insufficient documentation

## 2011-05-01 DIAGNOSIS — R209 Unspecified disturbances of skin sensation: Secondary | ICD-10-CM | POA: Insufficient documentation

## 2011-05-01 DIAGNOSIS — I10 Essential (primary) hypertension: Secondary | ICD-10-CM | POA: Insufficient documentation

## 2011-05-01 DIAGNOSIS — M25559 Pain in unspecified hip: Secondary | ICD-10-CM | POA: Insufficient documentation

## 2011-05-01 DIAGNOSIS — R350 Frequency of micturition: Secondary | ICD-10-CM | POA: Insufficient documentation

## 2011-05-01 DIAGNOSIS — R1110 Vomiting, unspecified: Secondary | ICD-10-CM | POA: Insufficient documentation

## 2011-05-01 DIAGNOSIS — M25552 Pain in left hip: Secondary | ICD-10-CM

## 2011-05-01 LAB — BASIC METABOLIC PANEL
CO2: 30 mEq/L (ref 19–32)
Chloride: 100 mEq/L (ref 96–112)
Glucose, Bld: 107 mg/dL — ABNORMAL HIGH (ref 70–99)
Sodium: 138 mEq/L (ref 135–145)

## 2011-05-01 LAB — DIFFERENTIAL
Eosinophils Relative: 2 % (ref 0–5)
Lymphocytes Relative: 46 % (ref 12–46)
Lymphs Abs: 1.8 10*3/uL (ref 0.7–4.0)
Monocytes Absolute: 0.2 10*3/uL (ref 0.1–1.0)
Monocytes Relative: 6 % (ref 3–12)

## 2011-05-01 LAB — URINALYSIS, ROUTINE W REFLEX MICROSCOPIC
Bilirubin Urine: NEGATIVE
Glucose, UA: NEGATIVE mg/dL
Hgb urine dipstick: NEGATIVE
Ketones, ur: NEGATIVE mg/dL
Leukocytes, UA: NEGATIVE
pH: 6.5 (ref 5.0–8.0)

## 2011-05-01 LAB — CBC
HCT: 33.3 % — ABNORMAL LOW (ref 36.0–46.0)
Hemoglobin: 11 g/dL — ABNORMAL LOW (ref 12.0–15.0)
MCH: 31 pg (ref 26.0–34.0)
MCV: 93.8 fL (ref 78.0–100.0)
RBC: 3.55 MIL/uL — ABNORMAL LOW (ref 3.87–5.11)
WBC: 3.9 10*3/uL — ABNORMAL LOW (ref 4.0–10.5)

## 2011-05-01 MED ORDER — MORPHINE SULFATE 4 MG/ML IJ SOLN
2.0000 mg | Freq: Once | INTRAMUSCULAR | Status: AC
  Administered 2011-05-01: 2 mg via INTRAVENOUS
  Filled 2011-05-01: qty 1

## 2011-05-01 MED ORDER — OXYCODONE-ACETAMINOPHEN 5-325 MG PO TABS: 1.0000 | ORAL_TABLET | Freq: Four times a day (QID) | ORAL | Status: AC | PRN

## 2011-05-01 NOTE — ED Notes (Signed)
Pt c/o numbness to lle and pain to left hip. Pt also c/o vomiting that began yesterday.

## 2011-05-01 NOTE — ED Provider Notes (Signed)
History  Scribed for Dr. Bebe Shaggy, the patient was seen in room APA19. The chart was scribed by Gilman Schmidt. The patients care was started at 1122. CSN: [id number] Arrival date & time: 05/01/2011 11:00 AM  Chief Complaint  Patient presents with  . Emesis  . Numbness    to left leg, side of hip surgery in 2006  . Urinary Frequency   HPI Tammy Sims is a 77 y.o. female who presents to the Emergency Department complaining of numbness to left leg. Pt additionally describes pain in left hip radiating to left thigh, worsening after walking up stairs onset one week. Associated symptoms of sweating, urine frequency, and nausea (onset yesterday). Denies any recent surgery or recent injury. There are no other associated symptoms and no other alleviating or aggravating factors.  No abd pain reported No falls reported HPI ELEMENTS:  Location: left leg Onset: one week ago Duration: persistent since onset  Timing: constant  Quality: numbness  Context:  as above  Associated symptoms: swe  PAST MEDICAL HISTORY:  Past Medical History  Diagnosis Date  . Hypertension      PAST SURGICAL HISTORY:  Past Surgical History  Procedure Date  . Total hip arthroplasty   . Cholecystectomy   . Abdominal hysterectomy      MEDICATIONS:  Previous Medications   No medications on file     ALLERGIES:  Allergies as of 05/01/2011  . (No Known Allergies)     FAMILY HISTORY:  History reviewed. No pertinent family history.   SOCIAL HISTORY: History  Substance Use Topics  . Smoking status: Never Smoker   . Smokeless tobacco: Not on file  . Alcohol Use: No     Review of Systems  Constitutional: Positive for diaphoresis.  Gastrointestinal: Positive for nausea and vomiting.  Genitourinary: Positive for frequency.  Musculoskeletal:       Hip pain Thigh pain  Neurological: Positive for numbness.  All other systems reviewed and are negative.    Allergies  Review of patient's allergies  indicates no known allergies.  Home Medications  No current outpatient prescriptions on file.  BP 183/77  Pulse 82  Temp(Src) 98.4 F (36.9 C) (Oral)  Resp 16  SpO2 99%  Physical Exam  CONSTITUTIONAL: Well developed/well nourished HEAD AND FACE: Normocephalic/atraumatic EYES: EOMI/PERRL ENMT: Mucous membranes moist NECK: supple no meningeal signs SPINE:entire spine nontender CV: S1/S2 noted, no murmurs/rubs/gallops noted LUNGS: Lungs are clear to auscultation bilaterally, no apparent distress ABDOMEN: soft, nontender, no rebound or guarding GU:no cva tenderness, no flank pain  NEURO: Pt is awake/alert, moves all extremities x4, good pulses, good motor function EXTREMITIES: pulses normal, full ROM, left thigh tenderness, ROM tenderness  SKIN: warm, color normal, no LE brusing PSYCH: no abnormalities of mood noted    ED Course  Procedures  OTHER DATA REVIEWED: Nursing notes, vital signs, and past medical records reviewed. All labs/vitals reviewed and considered   DIAGNOSTIC STUDIES: Oxygen Saturation is 99% on room air, normal by my interpretation.    LABS: reviewed, mild anemia noted Results for orders placed during the hospital encounter of 05/01/11  BASIC METABOLIC PANEL      Component Value Range   Sodium 138  135 - 145 (mEq/L)   Potassium 3.7  3.5 - 5.1 (mEq/L)   Chloride 100  96 - 112 (mEq/L)   CO2 30  19 - 32 (mEq/L)   Glucose, Bld 107 (*) 70 - 99 (mg/dL)   BUN 15  6 - 23 (mg/dL)  Creatinine, Ser 1.39 (*) 0.50 - 1.10 (mg/dL)   Calcium 9.7  8.4 - 16.1 (mg/dL)   GFR calc non Af Amer 37 (*) >60 (mL/min)   GFR calc Af Amer 44 (*) >60 (mL/min)  CBC      Component Value Range   WBC 3.9 (*) 4.0 - 10.5 (K/uL)   RBC 3.55 (*) 3.87 - 5.11 (MIL/uL)   Hemoglobin 11.0 (*) 12.0 - 15.0 (g/dL)   HCT 09.6 (*) 04.5 - 46.0 (%)   MCV 93.8  78.0 - 100.0 (fL)   MCH 31.0  26.0 - 34.0 (pg)   MCHC 33.0  30.0 - 36.0 (g/dL)   RDW 40.9  81.1 - 91.4 (%)   Platelets 141 (*)  150 - 400 (K/uL)  DIFFERENTIAL      Component Value Range   Neutrophils Relative 46  43 - 77 (%)   Neutro Abs 1.8  1.7 - 7.7 (K/uL)   Lymphocytes Relative 46  12 - 46 (%)   Lymphs Abs 1.8  0.7 - 4.0 (K/uL)   Monocytes Relative 6  3 - 12 (%)   Monocytes Absolute 0.2  0.1 - 1.0 (K/uL)   Eosinophils Relative 2  0 - 5 (%)   Eosinophils Absolute 0.1  0.0 - 0.7 (K/uL)   Basophils Relative 1  0 - 1 (%)   Basophils Absolute 0.0  0.0 - 0.1 (K/uL)  URINALYSIS, ROUTINE W REFLEX MICROSCOPIC      Component Value Range   Color, Urine YELLOW  YELLOW    Appearance CLEAR  CLEAR    Specific Gravity, Urine 1.015  1.005 - 1.030    pH 6.5  5.0 - 8.0    Glucose, UA NEGATIVE  NEGATIVE (mg/dL)   Hgb urine dipstick NEGATIVE  NEGATIVE    Bilirubin Urine NEGATIVE  NEGATIVE    Ketones, ur NEGATIVE  NEGATIVE (mg/dL)   Protein, ur NEGATIVE  NEGATIVE (mg/dL)   Urobilinogen, UA 0.2  0.0 - 1.0 (mg/dL)   Nitrite NEGATIVE  NEGATIVE    Leukocytes, UA NEGATIVE  NEGATIVE     RADIOLOGY:  DG Lumbar Spine 4 View. Reviewed by me. IMPRESSION: Stable degenerative lumbar spondylosis with advanced facet disease. No acute findings. Original Report Authenticated By: P. Loralie Champagne, M.D.  DG Hip Complete 2 View. Reviewed by me. IMPRESSION:  1. Total left hip arthroplasty appears well seated without complicating features. 2. Advanced degenerative disease involving the right hip. Original Report Authenticated By: P. Loralie Champagne, M.D.   ED COURSE / COORDINATION OF CARE: 1122:  - Patient evaluated by ED physician, DG Lumbar, DG Hip, labs ordered   MDM: Patient is  well-appearing, no focal neuro deficits in the lower extremity, no abdominal tenderness, doubt  AAA or other acute vascular process.  Denies low back pain.  Reports left hip pain and numbness in left thigh only.  Distal pulses intact.   She was ambulatory without any issue in the ED IMPRESSION: Diagnoses that have been ruled out:  Diagnoses that are  still under consideration:  Final diagnoses:    PLAN:  Home Narcotic pain medication The patient is to return the emergency department if there is any worsening of symptoms. I have reviewed the discharge instructions with the patient/family  CONDITION ON DISCHARGE: Good  MEDICATIONS GIVEN IN THE E.D. Medications - No data to display  DISCHARGE MEDICATIONS: New Prescriptions   No medications on file    SCRIBE ATTESTATION: I personally performed the services described in this documentation, which was  scribed in my presence. The recorded information has been reviewed and considered. Joya Gaskins, MD            Joya Gaskins, MD 05/01/11 [phone]

## 2011-05-01 NOTE — ED Notes (Signed)
Pt denies abd pain at present. Denies L hip pain except when walking.

## 2011-05-04 LAB — CBC
HCT: 34.3 — ABNORMAL LOW
Hemoglobin: 11.4 — ABNORMAL LOW
MCV: 92.6
RDW: 13.1

## 2011-05-04 LAB — DIFFERENTIAL
Basophils Absolute: 0
Eosinophils Relative: 3
Lymphocytes Relative: 49 — ABNORMAL HIGH
Monocytes Absolute: 0.3

## 2011-05-13 LAB — CBC
HCT: 31.3 — ABNORMAL LOW
MCV: 91.3
Platelets: 129 — ABNORMAL LOW
RDW: 12.9

## 2011-05-13 LAB — DIFFERENTIAL
Basophils Absolute: 0
Eosinophils Absolute: 0.1
Eosinophils Relative: 2
Lymphocytes Relative: 49 — ABNORMAL HIGH

## 2011-05-17 LAB — DIFFERENTIAL
Basophils Absolute: 0.1
Basophils Relative: 2 — ABNORMAL HIGH
Eosinophils Absolute: 0.1
Monocytes Absolute: 0.3
Neutro Abs: 2.2
Neutrophils Relative %: 49

## 2011-05-17 LAB — VITAMIN B12: Vitamin B-12: 278 (ref 211–911)

## 2011-05-17 LAB — CBC
MCHC: 33.3
MCV: 90.7
RDW: 13.1

## 2011-05-17 LAB — FOLATE: Folate: 17.6

## 2011-05-31 ENCOUNTER — Other Ambulatory Visit (HOSPITAL_COMMUNITY): Payer: Self-pay | Admitting: Family Medicine

## 2011-05-31 DIAGNOSIS — Z139 Encounter for screening, unspecified: Secondary | ICD-10-CM

## 2011-07-02 ENCOUNTER — Ambulatory Visit (HOSPITAL_COMMUNITY)
Admission: RE | Admit: 2011-07-02 | Discharge: 2011-07-02 | Disposition: A | Discharge status: Home / Self Care | Payer: Medicare Other | Source: Ambulatory Visit | Attending: Family Medicine | Admitting: Family Medicine

## 2011-07-02 DIAGNOSIS — Z1231 Encounter for screening mammogram for malignant neoplasm of breast: Secondary | ICD-10-CM | POA: Insufficient documentation

## 2011-07-02 DIAGNOSIS — Z139 Encounter for screening, unspecified: Secondary | ICD-10-CM

## 2011-07-13 ENCOUNTER — Encounter (HOSPITAL_COMMUNITY): Payer: Medicare Other | Attending: Oncology

## 2011-07-13 DIAGNOSIS — E860 Dehydration: Secondary | ICD-10-CM | POA: Insufficient documentation

## 2011-07-13 DIAGNOSIS — D61818 Other pancytopenia: Secondary | ICD-10-CM

## 2011-07-13 LAB — CBC
Hemoglobin: 11.2 g/dL — ABNORMAL LOW (ref 12.0–15.0)
MCH: 31.1 pg (ref 26.0–34.0)
MCHC: 33 g/dL (ref 30.0–36.0)
MCV: 94.2 fL (ref 78.0–100.0)
RBC: 3.6 MIL/uL — ABNORMAL LOW (ref 3.87–5.11)

## 2011-07-13 LAB — DIFFERENTIAL
Basophils Absolute: 0 10*3/uL (ref 0.0–0.1)
Eosinophils Relative: 3 % (ref 0–5)
Lymphocytes Relative: 53 % — ABNORMAL HIGH (ref 12–46)
Lymphs Abs: 2.2 10*3/uL (ref 0.7–4.0)
Neutro Abs: 1.4 10*3/uL — ABNORMAL LOW (ref 1.7–7.7)
Neutrophils Relative %: 35 % — ABNORMAL LOW (ref 43–77)

## 2011-07-13 LAB — COMPREHENSIVE METABOLIC PANEL
ALT: 10 U/L (ref 0–35)
BUN: 32 mg/dL — ABNORMAL HIGH (ref 6–23)
CO2: 29 mEq/L (ref 19–32)
Calcium: 10.4 mg/dL (ref 8.4–10.5)
Creatinine, Ser: 1.65 mg/dL — ABNORMAL HIGH (ref 0.50–1.10)
GFR calc Af Amer: 33 mL/min — ABNORMAL LOW (ref >90)
GFR calc non Af Amer: 28 mL/min — ABNORMAL LOW (ref >90)
Glucose, Bld: 94 mg/dL (ref 70–99)
Sodium: 142 mEq/L (ref 135–145)
Total Protein: 7.6 g/dL (ref 6.0–8.3)

## 2011-07-13 NOTE — Progress Notes (Signed)
Labs drawn today for cbc/diff.cmp 

## 2011-07-14 ENCOUNTER — Encounter (HOSPITAL_BASED_OUTPATIENT_CLINIC_OR_DEPARTMENT_OTHER): Payer: Medicare Other | Admitting: Oncology

## 2011-07-14 ENCOUNTER — Encounter (HOSPITAL_COMMUNITY): Payer: Self-pay | Admitting: Oncology

## 2011-07-14 DIAGNOSIS — E860 Dehydration: Secondary | ICD-10-CM

## 2011-07-14 DIAGNOSIS — D61818 Other pancytopenia: Secondary | ICD-10-CM

## 2011-07-14 NOTE — Patient Instructions (Signed)
Tammy Sims  [id number] [birth year]  [facility]  Discharge Instructions  RECOMMENDATIONS MADE BY THE CONSULTANT AND ANY TEST RESULTS WILL BE SENT TO YOUR REFERRING DOCTOR.   EXAM FINDINGS BY MD TODAY AND SIGNS AND SYMPTOMS TO REPORT TO CLINIC OR PRIMARY MD: Your blood counts are stable.  Report infections, bleeding, etc.  Need to increase fluid intake to 8 - 10 (8oz glasses) per day.  Your labs did show you were a little dehydrated.      SPECIAL INSTRUCTIONS/FOLLOW-UP: Lab work Needed in 6 months and Return to Clinic on in 6 months after lab work.   I acknowledge that I have been informed and understand all the instructions given to me and received a copy. I do not have any more questions at this time, but understand that I may call the [facility]ty [facility] at [facility] at [phone] during business hours should I have any further questions or need assistance in obtaining follow-up care.    __________________________________________  _____________  __________ Signature of Patient or Authorized Representative            Date                   Time    __________________________________________ Nurse's Signature

## 2011-07-14 NOTE — Progress Notes (Signed)
[name], MD [address] [place] [address]  1. Pancytopenia  CBC, Differential, Comprehensive metabolic panel    CURRENT THERAPY: Observation  INTERVAL HISTORY: Tammy Sims 77 y.o. African American female returns for  regular  visit for followup of pancytopenia with occasional white blood cell count within normal range. This is very stable and has not changed as of 2008.  Patient was in the emergency room on 05/01/2011 with left lower tremor the discomfort. She reports that this discomfort has since resolved. While in the emergency room plane x-rays were performed all of which were negative. Next  Otherwise the patient is doing very well. She denies any complaints today. Her appetite is strong. She is moving her bowels appropriately. She denies any urinary complaints. She denies any fevers, chills, night sweats. No headaches dizziness or double vision.  We spent some time going over her most recent mammogram which was negative. It was a BI-RADS category 1.  I personally reviewed and went over radiographic studies with the patient. We also went over her x-rays from September 2012 which again were negative.  I personally reviewed and went over laboratory results with the patient.  Her blood counts remained very stable. Today her white blood cell count is within normal range. Hemoglobin is stable at approximately 11 g. Her platelet count is also stable at 139,000.  Her complete metabolic panel is also stable with signs of dehydration. I've encouraged the patient to drink 8-10 glasses of water daily. She reports that due to the colder weather, she does not have the desire to drink water. She reports that she will try to drink more fluids to correct this dehydration.  We will continue to monitor her blood counts with lab work every 6 months and a return visit every 6 months.   Past Medical History  Diagnosis Date  . Hypertension     has HYPOTHYROIDISM;  HYPERLIPIDEMIA; PANCYTOPENIA; ANXIETY; DEPRESSION; HYPERTENSION; GI BLEEDING; KIDNEY DISEASE, CHRONIC, STAGE III; OSTEOARTHRITIS; SHOULDER PAIN; INSOMNIA; FATIGUE; EDEMA; and DYSPNEA ON EXERTION on her problem list.      has no known allergies.  Ms. Wolgamott does not currently have medications on file.  Past Surgical History  Procedure Date  . Total hip arthroplasty   . Cholecystectomy   . Abdominal hysterectomy     Denies any headaches, dizziness, double vision, fevers, chills, night sweats, nausea, vomiting, diarrhea, constipation, chest pain, heart palpitations, shortness of breath, blood in stool, black tarry stool, urinary pain, urinary burning, urinary frequency, hematuria.   PHYSICAL EXAMINATION  ECOG PERFORMANCE STATUS: 1 - Symptomatic but completely ambulatory  Filed Vitals:   07/14/11 0923  BP: 147/75  Pulse: 71  Temp: 97.4 F (36.3 C)    GENERAL:alert, no distress, well nourished, well developed, comfortable, cooperative, obese and smiling SKIN: skin color, texture, turgor are normal HEAD: Normocephalic EYES: normal EARS: External ears normal OROPHARYNX:mucous membranes are moist  NECK: supple, trachea midline LYMPH:  no palpable lymphadenopathy BREAST:not examined LUNGS: clear to auscultation and percussion HEART: regular rate & rhythm, no murmurs, no gallops, S1 normal and S2 normal ABDOMEN:abdomen soft, non-tender, obese and normal bowel sounds BACK: Back symmetric, no curvature., No CVA tenderness EXTREMITIES:less then 2 second capillary refill, no joint deformities, effusion, or inflammation, no skin discoloration, no clubbing, no cyanosis, positive findings:  edema trace to 1+ nonpitting edema in lower extremities.  NEURO: alert & oriented x 3 with fluent speech, no focal motor/sensory deficits   LABORATORY DATA: CBC  Component Value Date/Time   WBC 4.1 07/13/2011 0854   RBC 3.60* 07/13/2011 0854   HGB 11.2* 07/13/2011 0854   HCT 33.9* 07/13/2011  0854   PLT 139* 07/13/2011 0854   MCV 94.2 07/13/2011 0854   MCH 31.1 07/13/2011 0854   MCHC 33.0 07/13/2011 0854   RDW 12.4 07/13/2011 0854   LYMPHSABS 2.2 07/13/2011 0854   MONOABS 0.3 07/13/2011 0854   EOSABS 0.1 07/13/2011 0854   BASOSABS 0.0 07/13/2011 0854      Chemistry      Component Value Date/Time   NA 142 07/13/2011 0854   K 4.1 07/13/2011 0854   CL 104 07/13/2011 0854   CO2 29 07/13/2011 0854   BUN 32* 07/13/2011 0854   CREATININE 1.65* 07/13/2011 0854      Component Value Date/Time   CALCIUM 10.4 07/13/2011 0854   ALKPHOS 105 07/13/2011 0854   AST 15 07/13/2011 0854   ALT 10 07/13/2011 0854   BILITOT 0.3 07/13/2011 0854        RADIOGRAPHIC STUDIES:  07/05/11  DG SCREEN MAMMOGRAM BILATERAL  Bilateral CC and MLO view(s) were taken.  Technologist: Faith Little  DIGITAL SCREENING MAMMOGRAM WITH CAD:  The breast tissue is almost entirely fatty. No masses or malignant type calcifications are  identified. Compared with prior studies.  Images were processed with CAD.  IMPRESSION:  No specific mammographic evidence of malignancy. Next screening mammogram is recommended in one  year.  A result letter of this screening mammogram will be mailed directly to the patient.  ASSESSMENT: Negative - BI-RADS 1  Screening mammogram in 1 year.  05/01/11  *RADIOLOGY REPORT*  Clinical Data: Right hip pain.  LEFT HIP - COMPLETE 2+ VIEW  Comparison: None  Findings: There are severe right hip joint degenerative changes  with joint space narrowing and osteophytic spurring. A left total  hip arthroplasty is noted. No acute fracture or findings for  avascular necrosis. Pubic symphysis and SI joints are intact. No  complicating features associated with the left hip prosthesis.  IMPRESSION:  1. Total left hip arthroplasty appears well seated without  complicating features.  2. Advanced degenerative disease involving the right hip.  Original Report Authenticated By: P.  Loralie Champagne, M.D.  05/01/11  *RADIOLOGY REPORT*  Clinical Data: Back pain.  LUMBAR SPINE - COMPLETE 4+ VIEW  Comparison: CT scan 10/20/2010.  Findings: Stable degenerative lumbar spondylosis with disc disease  and advanced facet disease. No acute bony findings or destructive  bony changes. The visualized bony pelvis is intact.  IMPRESSION:  Stable degenerative lumbar spondylosis with advanced facet disease.  No acute findings.  Original Report Authenticated By: P. Loralie Champagne, M.D.    ASSESSMENT:  1. Pancytopenia 2. Dehydration per CMET on 07/13/11.   PLAN:  1. Lab work in 6 months: CBC diff, CMET 2. Return in 6 months for follow-up 3. Encouraged the patient to push fluids 8-10 glasses of H2O daily.  She was dehydrated on lab work yesterday. 4. I personally reviewed and went over laboratory results with the patient. 5. I personally reviewed and went over radiographic studies with the patient.  All questions were answered. The patient knows to call the clinic with any problems, questions or concerns. We can certainly see the patient much sooner if necessary.  The patient and plan discussed with Glenford Peers, MD and he is in agreement with the aforementioned.   Tammy Sims

## 2011-09-22 ENCOUNTER — Ambulatory Visit: Payer: Medicare Other | Admitting: Cardiology

## 2011-09-27 ENCOUNTER — Encounter: Payer: Self-pay | Admitting: Cardiology

## 2011-09-28 ENCOUNTER — Ambulatory Visit (INDEPENDENT_AMBULATORY_CARE_PROVIDER_SITE_OTHER): Payer: Medicare Other | Admitting: Cardiology

## 2011-09-28 ENCOUNTER — Encounter: Payer: Self-pay | Admitting: Cardiology

## 2011-09-28 VITALS — BP 175/83 | HR 77 | Resp 18 | Ht 61.0 in | Wt 204.0 lb

## 2011-09-28 DIAGNOSIS — I499 Cardiac arrhythmia, unspecified: Secondary | ICD-10-CM | POA: Insufficient documentation

## 2011-09-28 DIAGNOSIS — D696 Thrombocytopenia, unspecified: Secondary | ICD-10-CM | POA: Insufficient documentation

## 2011-09-28 DIAGNOSIS — N183 Chronic kidney disease, stage 3 (moderate): Secondary | ICD-10-CM

## 2011-09-28 DIAGNOSIS — I10 Essential (primary) hypertension: Secondary | ICD-10-CM

## 2011-09-28 DIAGNOSIS — E039 Hypothyroidism, unspecified: Secondary | ICD-10-CM

## 2011-09-28 DIAGNOSIS — N182 Chronic kidney disease, stage 2 (mild): Secondary | ICD-10-CM

## 2011-09-28 DIAGNOSIS — D649 Anemia, unspecified: Secondary | ICD-10-CM | POA: Insufficient documentation

## 2011-09-28 DIAGNOSIS — E669 Obesity, unspecified: Secondary | ICD-10-CM | POA: Insufficient documentation

## 2011-09-28 NOTE — Patient Instructions (Addendum)
Your physician recommends that you schedule a follow-up appointment in: 12 months  Your physician has recommended that you wear a holter monitor. Holter monitors are medical devices that record the heart's electrical activity. Doctors most often use these monitors to diagnose arrhythmias. Arrhythmias are problems with the speed or rhythm of the heartbeat. The monitor is a small, portable device. You can wear one while you do your normal daily activities. This is usually used to diagnose what is causing palpitations/syncope (passing out).  Your physician has recommended you make the following change in your medication:  Lasix may be taken as needed for swelling  Low Salt Diet

## 2011-09-29 ENCOUNTER — Ambulatory Visit (HOSPITAL_COMMUNITY)
Admission: RE | Admit: 2011-09-29 | Discharge: 2011-09-29 | Disposition: A | Discharge status: Home / Self Care | Payer: Medicare Other | Source: Ambulatory Visit | Attending: Cardiology | Admitting: Cardiology

## 2011-09-29 ENCOUNTER — Encounter: Payer: Self-pay | Admitting: Cardiology

## 2011-09-29 DIAGNOSIS — I4949 Other premature depolarization: Secondary | ICD-10-CM

## 2011-09-29 DIAGNOSIS — I499 Cardiac arrhythmia, unspecified: Secondary | ICD-10-CM

## 2011-09-29 DIAGNOSIS — R002 Palpitations: Secondary | ICD-10-CM | POA: Insufficient documentation

## 2011-09-29 NOTE — Assessment & Plan Note (Addendum)
In the absence of symptoms, it is unlikely that any intervention is needed for the patient's rhythm disturbance.  A 24 hour Holter recording will be obtained to further define the extent of the abnormality.  In the absence of impressive pauses, continued clinical follow up is all that is necessary.  A TSH level will be measured.

## 2011-09-29 NOTE — Progress Notes (Signed)
*  PRELIMINARY RESULTS* Echocardiogram 48H Holter monitor has been performed.  Conrad Mentor 09/29/2011, 10:35 AM

## 2011-09-29 NOTE — Assessment & Plan Note (Signed)
Blood pressure is elevated at this visit, but patient reports normal values with systolics typically below 140 at home.  She will collect additional measurements prior to her next office visit.

## 2011-09-29 NOTE — Assessment & Plan Note (Addendum)
Patient has had a mild anemia for some time.  She was evaluated by Dr. Mariel Sleet In 01/2011.  Anemia and thrombocytopenia have consistently been present, but leukopenia has been variable since 2008.  No etiology has been established, but no testing has been thought necessary including a bone marrow examination.  Most recently, she also had mild thrombocytopenia but low normal WBCs.

## 2011-09-29 NOTE — Assessment & Plan Note (Signed)
Patient was euthyroid when last assessed in 04/2011 when TSH was borderline high.

## 2011-09-29 NOTE — Assessment & Plan Note (Signed)
Moderate renal insufficiency with a calculated GFR of 40 mL per minute in 04/2011.

## 2011-09-29 NOTE — Progress Notes (Signed)
Patient ID: Tammy Sims, female   DOB: February 28, 1930, 78 y.o.   MRN: [id number] HPI: Very pleasant older woman referred at the kind request of Dr. Sudie Bailey for evaluation of rhythm disturbance.  This nice woman has enjoyed generally good health.  She was evaluated for chest discomfort some years ago with a negative stress test, but has no known cardiac problems.  Pulse irregularity was noted at recent office visit, and verified with an EKG.  Patient denies lightheadedness, palpitations and has experienced no syncope nor falls.  Current Outpatient Prescriptions on File Prior to Visit  Medication Sig Dispense Refill  . Calcium Carbonate-Vitamin D (CALCIUM + D PO) Take 1 tablet by mouth daily.        . furosemide (LASIX) 40 MG tablet Take 20 mg by mouth as needed.       Marland Kitchen HYDROcodone-acetaminophen (VICODIN) 5-500 MG per tablet Take 1 tablet by mouth as needed. For shoulder pain       . levothyroxine (SYNTHROID, LEVOTHROID) 25 MCG tablet Take 25 mcg by mouth daily.        Marland Kitchen LORazepam (ATIVAN) 1 MG tablet Take 1 mg by mouth at bedtime.        Marland Kitchen losartan-hydrochlorothiazide (HYZAAR) 100-12.5 MG per tablet Take 1 tablet by mouth daily.        . vitamin B-12 (CYANOCOBALAMIN) 1000 MCG tablet Take 1,000 mcg by mouth daily.          No Known Allergies    Past Medical History  Diagnosis Date  . Hypertension     Lab: 08/2011 Normal BMet except creatinine of 1.3Stress nuclear in 10/2002; no definite abnormality, normal EF  . Obesity   . Depression   . Hypothyroidism   . Degenerative joint disease     Left THA in 2004  . Upper GI bleed 2004    NSAID- induced; gastritis, multiple gastric ulcers and duodenal ulcers  . Hematochezia 2002    hemorrhoids, rectal polyp  . Anemia     H&H of 11.3/33.6 in 08/2011 with normal MCV  . Chronic kidney disease, stage 2, mildly decreased GFR     Creatinine of 1.3 in 08/2011  . Thrombocytopenia     Platelets of 116,000 in 08/2011  . Arrhythmia     PACs, sinus  bradycardia, sinus arrhythmia sinus pauses     Past Surgical History  Procedure Date  . Total hip arthroplasty 05/2003    Left  . Cholecystectomy 10/2010    Laparoscopic  . Abdominal hysterectomy     plus unilateral oophorectomy, fibroids  . Cataract extraction, bilateral   . Colonoscopy 2002    Rectal polyp and hemorrhoids     Family History  Problem Relation Age of Onset  . Hypertension Mother      History   Social History  . Marital Status: Widowed    Spouse Name: N/A    Number of Children: N/A  . Years of Education: N/A   Occupational History  . Homemaker    Social History Main Topics  . Smoking status: Never Smoker   . Smokeless tobacco: Never Used  . Alcohol Use: No  . Drug Use: No  . Sexually Active: Not on file   Other Topics Concern  . Not on file   Social History Narrative   Daughter is a Engineer, civil (consulting) at [employer]   ROS:  Requires corrective lenses; mild dyspnea on exertion; recent impaired appetite; cold intolerance; arthritic discomfort; intermittent pedal edema.  All other systems reviewed  and are negative.  PHYSICAL EXAM: BP 175/83  Pulse 77  Resp 18  Ht 5\' 1"  (1.549 m)  Wt 92.534 kg (204 lb)  BMI 38.55 kg/m2.  Repeat blood pressure-165/80 General-Well-developed; no acute distress; Appears anxious; soft spoken-daughter answers many of questions directed at patient Body Habitus-Moderately overweight HEENT-[place]/AT; PERRL; EOM intact; conjunctiva and lids nl Neck-No JVD; no carotid bruits Endocrine-Mild diffuse thyromegaly Lungs-Clear lung fields; resonant percussion; normal I-to-E ratio Cardiovascular- normal PMI; normal S1 and S2; irregular rhythm Abdomen-BS normal; soft and non-tender without masses or organomegaly Musculoskeletal-No deformities, cyanosis or clubbing Neurologic-Nl cranial nerves; symmetric strength and tone Skin- Warm, no significant lesions Extremities-Nl distal pulses; 1/2+ edema  EKG:  Tracing performed 08/23/11 obtained  and reviewed .  Normal sinus rhythm with sinus arrhythmia and PACs; right axis deviation vs possible lead reversal; Low voltage in the limb leads; non-sinus atrial escape beat.  ASSESSMENT AND PLAN:  Speed Bing, MD 09/29/2011 9:50 AM

## 2011-09-30 ENCOUNTER — Telehealth: Payer: Self-pay | Admitting: *Deleted

## 2012-01-12 ENCOUNTER — Encounter (HOSPITAL_COMMUNITY): Payer: Medicare Other | Attending: Oncology

## 2012-01-12 DIAGNOSIS — E538 Deficiency of other specified B group vitamins: Secondary | ICD-10-CM | POA: Insufficient documentation

## 2012-01-12 DIAGNOSIS — D61818 Other pancytopenia: Secondary | ICD-10-CM | POA: Insufficient documentation

## 2012-01-12 LAB — COMPREHENSIVE METABOLIC PANEL
ALT: 10 U/L (ref 0–35)
AST: 17 U/L (ref 0–37)
CO2: 27 mEq/L (ref 19–32)
Chloride: 103 mEq/L (ref 96–112)
Creatinine, Ser: 1.48 mg/dL — ABNORMAL HIGH (ref 0.50–1.10)
GFR calc Af Amer: 38 mL/min — ABNORMAL LOW (ref >90)
GFR calc non Af Amer: 32 mL/min — ABNORMAL LOW (ref >90)
Glucose, Bld: 93 mg/dL (ref 70–99)
Total Bilirubin: 0.3 mg/dL (ref 0.3–1.2)

## 2012-01-12 LAB — DIFFERENTIAL
Basophils Absolute: 0 10*3/uL (ref 0.0–0.1)
Eosinophils Relative: 3 % (ref 0–5)
Lymphocytes Relative: 38 % (ref 12–46)
Lymphs Abs: 1.6 10*3/uL (ref 0.7–4.0)
Monocytes Absolute: 0.3 10*3/uL (ref 0.1–1.0)
Neutro Abs: 2.1 10*3/uL (ref 1.7–7.7)

## 2012-01-12 LAB — CBC
HCT: 32.3 % — ABNORMAL LOW (ref 36.0–46.0)
Hemoglobin: 10.5 g/dL — ABNORMAL LOW (ref 12.0–15.0)
MCV: 93.4 fL (ref 78.0–100.0)
RBC: 3.46 MIL/uL — ABNORMAL LOW (ref 3.87–5.11)
RDW: 12.5 % (ref 11.5–15.5)
WBC: 4.2 10*3/uL (ref 4.0–10.5)

## 2012-01-12 NOTE — Progress Notes (Signed)
Labs drawn today for cbc/diff.cmp 

## 2012-01-17 ENCOUNTER — Encounter (HOSPITAL_BASED_OUTPATIENT_CLINIC_OR_DEPARTMENT_OTHER): Payer: Medicare Other | Admitting: Oncology

## 2012-01-17 VITALS — BP 150/80 | HR 57 | Temp 97.4°F | Wt 201.6 lb

## 2012-01-17 DIAGNOSIS — D61818 Other pancytopenia: Secondary | ICD-10-CM

## 2012-01-17 DIAGNOSIS — D696 Thrombocytopenia, unspecified: Secondary | ICD-10-CM

## 2012-01-17 LAB — HOMOCYSTEINE: Homocysteine: 13.1 umol/L (ref 4.0–15.4)

## 2012-01-17 NOTE — Progress Notes (Signed)
Diagnosis #1 pancytopenia Beya is here today with her daughter but Kimble has been seen here since 02/24/2007. Her pancytopenia has essentially remained very very stable with no changes on her most recent blood work. Occasionally her white count is in the normal range. Since she is on oral B12 and with the recent [place] Journal article on B12 deficiency I do believe I need to check her methylmalonic acid level homocystine level BXII and folic acid levels today to make sure we are not missing anything. She's had a CT scan and ultrasound her abdomen which is not revealed splenomegaly and her medications are extensive but they are very stable. She does not feel any different and doesn't have any neurological symptoms suggestive of B12 deficiency I will check her levels again as mentioned above and we will see her back in 6 months for followup.

## 2012-01-17 NOTE — Patient Instructions (Signed)
DENIM START  [id number] July 27, 1930 Dr. Glenford Peers   [facility]  Discharge Instructions  RECOMMENDATIONS MADE BY THE CONSULTANT AND ANY TEST RESULTS WILL BE SENT TO YOUR REFERRING DOCTOR.   EXAM FINDINGS BY MD TODAY AND SIGNS AND SYMPTOMS TO REPORT TO CLINIC OR PRIMARY MD: As discussed by Dr. Mariel Sleet.  SPECIAL INSTRUCTIONS/FOLLOW-UP: 1.  Please keep your 6 month follow-up appointment. 2.  You had lab work done today, and we will call you with any abnormal results.  I acknowledge that I have been informed and understand all the instructions given to me and received a copy. I do not have any more questions at this time, but understand that I may call the Specialty [facility] at [facility] at [phone] during business hours should I have any further questions or need assistance in obtaining follow-up care.    __________________________________________  _____________  __________ Signature of Patient or Authorized Representative            Date                   Time    __________________________________________ Nurse's Signature

## 2012-01-20 ENCOUNTER — Emergency Department (HOSPITAL_COMMUNITY): Payer: Medicare Other

## 2012-01-20 ENCOUNTER — Emergency Department (HOSPITAL_COMMUNITY)
Admission: EM | Admit: 2012-01-20 | Discharge: 2012-01-20 | Disposition: A | Discharge status: Home / Self Care | Payer: Medicare Other | Attending: Emergency Medicine | Admitting: Emergency Medicine

## 2012-01-20 ENCOUNTER — Encounter (HOSPITAL_COMMUNITY): Payer: Self-pay

## 2012-01-20 DIAGNOSIS — E039 Hypothyroidism, unspecified: Secondary | ICD-10-CM | POA: Insufficient documentation

## 2012-01-20 DIAGNOSIS — M79609 Pain in unspecified limb: Secondary | ICD-10-CM | POA: Insufficient documentation

## 2012-01-20 DIAGNOSIS — Z79899 Other long term (current) drug therapy: Secondary | ICD-10-CM | POA: Insufficient documentation

## 2012-01-20 DIAGNOSIS — N183 Chronic kidney disease, stage 3 unspecified: Secondary | ICD-10-CM | POA: Insufficient documentation

## 2012-01-20 DIAGNOSIS — Z96649 Presence of unspecified artificial hip joint: Secondary | ICD-10-CM | POA: Insufficient documentation

## 2012-01-20 DIAGNOSIS — F329 Major depressive disorder, single episode, unspecified: Secondary | ICD-10-CM | POA: Insufficient documentation

## 2012-01-20 DIAGNOSIS — R197 Diarrhea, unspecified: Secondary | ICD-10-CM

## 2012-01-20 DIAGNOSIS — R112 Nausea with vomiting, unspecified: Secondary | ICD-10-CM | POA: Insufficient documentation

## 2012-01-20 DIAGNOSIS — F3289 Other specified depressive episodes: Secondary | ICD-10-CM | POA: Insufficient documentation

## 2012-01-20 DIAGNOSIS — I129 Hypertensive chronic kidney disease with stage 1 through stage 4 chronic kidney disease, or unspecified chronic kidney disease: Secondary | ICD-10-CM | POA: Insufficient documentation

## 2012-01-20 LAB — COMPREHENSIVE METABOLIC PANEL
Alkaline Phosphatase: 92 U/L (ref 39–117)
BUN: 17 mg/dL (ref 6–23)
Creatinine, Ser: 1.32 mg/dL — ABNORMAL HIGH (ref 0.50–1.10)
GFR calc Af Amer: 43 mL/min — ABNORMAL LOW (ref >90)
Glucose, Bld: 109 mg/dL — ABNORMAL HIGH (ref 70–99)
Potassium: 3.3 mEq/L — ABNORMAL LOW (ref 3.5–5.1)
Total Bilirubin: 0.4 mg/dL (ref 0.3–1.2)
Total Protein: 7.8 g/dL (ref 6.0–8.3)

## 2012-01-20 LAB — DIFFERENTIAL
Eosinophils Absolute: 0 10*3/uL (ref 0.0–0.7)
Lymphs Abs: 1.7 10*3/uL (ref 0.7–4.0)
Monocytes Absolute: 0.4 10*3/uL (ref 0.1–1.0)
Monocytes Relative: 8 % (ref 3–12)
Neutrophils Relative %: 60 % (ref 43–77)

## 2012-01-20 LAB — LIPASE, BLOOD: Lipase: 40 U/L (ref 11–59)

## 2012-01-20 LAB — CBC
HCT: 34.2 % — ABNORMAL LOW (ref 36.0–46.0)
Hemoglobin: 11.3 g/dL — ABNORMAL LOW (ref 12.0–15.0)
MCH: 30.3 pg (ref 26.0–34.0)
MCV: 91.7 fL (ref 78.0–100.0)
RBC: 3.73 MIL/uL — ABNORMAL LOW (ref 3.87–5.11)

## 2012-01-20 MED ORDER — IBUPROFEN 800 MG PO TABS: 800.0000 mg | ORAL_TABLET | Freq: Three times a day (TID) | ORAL | Status: AC

## 2012-01-20 MED ORDER — HYDROCODONE-ACETAMINOPHEN 5-325 MG PO TABS: 2.0000 | ORAL_TABLET | ORAL | Status: AC | PRN

## 2012-01-20 MED ORDER — ONDANSETRON HCL 4 MG/2ML IJ SOLN
4.0000 mg | Freq: Once | INTRAMUSCULAR | Status: AC
  Administered 2012-01-20: 4 mg via INTRAVENOUS
  Filled 2012-01-20: qty 2

## 2012-01-20 MED ORDER — SODIUM CHLORIDE 0.9 % IV BOLUS (SEPSIS)
1000.0000 mL | Freq: Once | INTRAVENOUS | Status: AC
  Administered 2012-01-20: 1000 mL via INTRAVENOUS

## 2012-01-20 MED ORDER — ONDANSETRON HCL 4 MG PO TABS: 4.0000 mg | ORAL_TABLET | Freq: Four times a day (QID) | ORAL | Status: AC

## 2012-01-20 MED ORDER — POTASSIUM CHLORIDE 10 MEQ/100ML IV SOLN
10.0000 meq | Freq: Once | INTRAVENOUS | Status: AC
  Administered 2012-01-20: 10 meq via INTRAVENOUS
  Filled 2012-01-20: qty 100

## 2012-01-20 MED ORDER — MORPHINE SULFATE 4 MG/ML IJ SOLN
4.0000 mg | Freq: Once | INTRAMUSCULAR | Status: AC
  Administered 2012-01-20: 4 mg via INTRAVENOUS
  Filled 2012-01-20: qty 1

## 2012-01-20 MED ORDER — POTASSIUM CHLORIDE CRYS ER 20 MEQ PO TBCR
40.0000 meq | EXTENDED_RELEASE_TABLET | Freq: Once | ORAL | Status: AC
Start: 2012-01-20 — End: 2012-01-20
  Administered 2012-01-20: 40 meq via ORAL
  Filled 2012-01-20: qty 2

## 2012-01-20 NOTE — ED Notes (Signed)
1. Right leg pain since Tuesday, noted after taking trash can out.  Denies any known injury 2. N//v/d since Tuesday--has not been seen by pmd

## 2012-01-20 NOTE — ED Provider Notes (Signed)
History   This chart was scribed for Glynn Octave, MD by Clarita Crane. The patient was seen in room APA09/APA09. Patient's care was started at 1232.    CSN: [id number]  Arrival date & time 01/20/12  1232   First MD Initiated Contact with Patient 01/20/12 1258      Chief Complaint  Patient presents with  . Leg Pain    Left leg pain   . Nausea  . Emesis  . Diarrhea    (Consider location/radiation/quality/duration/timing/severity/associated sxs/prior treatment) HPI Tammy Sims is a 78 y.o. female who presents to the Emergency Department complaining of sudden onset of pain to LLE localized to left thigh and described as sharp that began 2 days ago while taking trash can out and has been persistent since. States pain is aggravated with standing upright. Patient also c/o moderate to severe  associated nausea, vomiting and diarrhea.  Reports having 1 episode of vomiting this morning but had several episodes yesterday. Denies recent injury/trauma, left knee pain, SOB, chest pain, fever, chills, numbness, tingling, back pain, incontinence and previous history of similar symptoms. Patient repots having recent evaluation by PCP but was not experiencing symptoms at that time. Patient with h/o left hip replacement  PCP- Sudie Bailey  Past Medical History  Diagnosis Date  . Hypertension     Lab: 08/2011 Normal BMet except creatinine of 1.3Stress nuclear in 10/2002; no definite abnormality, normal EF  . Obesity   . Depression   . Hypothyroidism   . Degenerative joint disease     Left THA in 2004  . Upper GI bleed 2004    NSAID- induced; gastritis, multiple gastric ulcers and duodenal ulcers  . Hematochezia 2002    hemorrhoids, rectal polyp  . Anemia     H&H of 11.3/33.6 in 08/2011 with normal MCV  . Chronic kidney disease, stage 3, mod decreased GFR     Creatinine of 1.3 in 08/2011  . Thrombocytopenia     Platelets of 116,000 in 08/2011  . Arrhythmia     PACs, sinus bradycardia, sinus  arrhythmia sinus pauses    Past Surgical History  Procedure Date  . Total hip arthroplasty 05/2003    Left  . Cholecystectomy 10/2010    Laparoscopic  . Abdominal hysterectomy     plus unilateral oophorectomy, fibroids  . Cataract extraction, bilateral   . Colonoscopy 2002    Rectal polyp and hemorrhoids    Family History  Problem Relation Age of Onset  . Hypertension Mother     History  Substance Use Topics  . Smoking status: Never Smoker   . Smokeless tobacco: Never Used  . Alcohol Use: No    OB History    Grav Para Term Preterm Abortions TAB SAB Ect Mult Living                  Review of Systems A complete 10 system review of systems was obtained and all systems are negative except as noted in the HPI and PMH.   Allergies  Review of patient's allergies indicates no known allergies.  Home Medications   Current Outpatient Rx  Name Route Sig Dispense Refill  . CALCIUM + D PO Oral Take 1 tablet by mouth daily.      . FUROSEMIDE 40 MG PO TABS Oral Take 20 mg by mouth as needed.     Marland Kitchen HYDROCHLOROTHIAZIDE 12.5 MG PO CAPS Oral Take 12.5 mg by mouth daily. Per Dr Payton Doughty to = Hyzaar 100/25 mg    .  HYDROCODONE-ACETAMINOPHEN 5-500 MG PO TABS Oral Take 1 tablet by mouth as needed. For shoulder pain     . LEVOTHYROXINE SODIUM 25 MCG PO TABS Oral Take 25 mcg by mouth daily.      Marland Kitchen LORAZEPAM 1 MG PO TABS Oral Take 1 mg by mouth at bedtime.      Marland Kitchen LOSARTAN POTASSIUM-HCTZ 100-12.5 MG PO TABS Oral Take 1 tablet by mouth daily.      Marland Kitchen VITAMIN B-12 1000 MCG PO TABS Oral Take 1,000 mcg by mouth daily.        BP 159/64  Pulse 64  Temp 98.3 F (36.8 C) (Oral)  Resp 18  Ht 5\' 1"  (1.549 m)  Wt 201 lb (91.173 kg)  BMI 37.98 kg/m2  SpO2 100%  Physical Exam  Nursing note and vitals reviewed. Constitutional: She is oriented to person, place, and time. She appears well-developed and well-nourished. No distress.  HENT:  Head: Normocephalic and atraumatic.  Eyes: EOM are  normal. Pupils are equal, round, and reactive to light.  Neck: Neck supple. No tracheal deviation present.  Cardiovascular: Normal rate and regular rhythm.  Exam reveals no gallop and no friction rub.   No murmur heard. Pulmonary/Chest: Effort normal. No respiratory distress. She has no wheezes. She has no rales.  Abdominal: Soft. She exhibits no distension. There is no tenderness.  Musculoskeletal: Normal range of motion. She exhibits no edema.       DP and PT pulses 2+ bilaterally. FROM left hip, no deformity, no rotation noted. No calf swelling or tenderness bilaterally. Able to wiggle toes bilaterally.  Neurological: She is alert and oriented to person, place, and time. No sensory deficit.       Strength 5/5 within bilateral lower extremities.   Skin: Skin is warm and dry.  Psychiatric: She has a normal mood and affect. Her behavior is normal.    ED Course  Procedures (including critical care time)  DIAGNOSTIC STUDIES: Oxygen Saturation is 100% on room air, normal by my interpretation.    COORDINATION OF CARE: 1:12PM-Patient informed of current plan for treatment and evaluation and agrees with plan at this time.     Labs Reviewed  CBC - Abnormal; Notable for the following:    RBC 3.73 (*)     Hemoglobin 11.3 (*)     HCT 34.2 (*)     All other components within normal limits  COMPREHENSIVE METABOLIC PANEL - Abnormal; Notable for the following:    Potassium 3.3 (*)     Glucose, Bld 109 (*)     Creatinine, Ser 1.32 (*)     GFR calc non Af Amer 37 (*)     GFR calc Af Amer 43 (*)     All other components within normal limits  DIFFERENTIAL  LIPASE, BLOOD  URINALYSIS, ROUTINE W REFLEX MICROSCOPIC   Dg Hip Complete Left  01/20/2012  *RADIOLOGY REPORT*  Clinical Data: Left hip pain.  No injury.  LEFT HIP - COMPLETE 2+ VIEW  Comparison: 05/01/2011  Findings: Changes of left hip replacement.  No hardware or bony complicating feature.  Advanced degenerative changes in the right hip  joint.  SI joints are symmetric and unremarkable. No acute bony abnormality.  Specifically, no fracture, subluxation, or dislocation.  Soft tissues are intact.  IMPRESSION: Left hip replacement.  Advanced degenerative changes in the right hip.  No acute findings.  Original Report Authenticated By: Cyndie Chime, M.D.   US Venous Img Lower Unilateral Left  01/20/2012  *  RADIOLOGY REPORT*  Clinical Data: Left leg pain  Left LOWER EXTREMITY VENOUS DUPLEX ULTRASOUND  Technique:  Gray-scale sonography with graded compression, as well as color Doppler and duplex ultrasound, were performed to evaluate the deep venous system of the lower extremity from the level of the common femoral vein through the popliteal and proximal calf veins. Spectral Doppler was utilized to evaluate flow at rest and with distal augmentation maneuvers.  Comparison:  None.  Findings: There is good lumen compression and flow augmentation involving the left common femoral, profunda femoral, superficial femoral, popliteal veins.  No intraluminal thrombus is identified.  IMPRESSION: There is no evidence of DVT within the left lower extremity.  Original Report Authenticated By: Brandon Melnick, M.D.   US Arterial Seg Single  01/20/2012  *RADIOLOGY REPORT*  Clinical Data: Right leg pain.  NONINVASIVE PHYSIOLOGIC VASCULAR STUDY OF BILATERAL LOWER EXTREMITIES  Technique:  Evaluation of both lower extremities were performed at rest, including calculation of ankle-brachial indices with single level Doppler, pressure and pulse volume recording.  Comparison:  None.  Findings:  Right ABI: 1.15  Left ABI: 1.18  Right Lower Extremity: Waveforms are biphasic or triphasic at the right ankle.  Left Lower Extremity: Waveforms are biphasic or triphasic at the left ankle.  IMPRESSION: Unremarkable study.  Original Report Authenticated By: Cyndie Chime, M.D.     No diagnosis found.    MDM  One day of nausea, vomiting, diarrhea without abdominal pain. Also  left leg pain since moving trash cans 2 days ago. No weakness, numbness, tingling, bowel or bladder habits, chest pain or shortness of breath. Sick contacts at nursing home.  Patient has pain in this region of femoral nerve in the anterior thigh. There is no deformity, asymmetry, or calf tenderness. She has no weakness in the leg. She has intact pulses and no evidence of acute ischemia. Doppler negative for DVT. Creatinine stable. Given IV fluids and antiemetics. No further vomiting in the ED. Ambulatory without distress. Leg pain improved.      I personally performed the services described in this documentation, which was scribed in my presence.  The recorded information has been reviewed and considered.    Glynn Octave, MD 01/20/12 1911

## 2012-01-20 NOTE — Discharge Instructions (Signed)
Nausea and Vomiting There is no evidence of a blood clot in your leg. Take the medicines as prescribed and follow up with Dr. Sudie Bailey this week.  Return to the ED if you develop new or worsening symptoms. Nausea is a sick feeling that often comes before throwing up (vomiting). Vomiting is a reflex where stomach contents come out of your mouth. Vomiting can cause severe loss of body fluids (dehydration). Children and elderly adults can become dehydrated quickly, especially if they also have diarrhea. Nausea and vomiting are symptoms of a condition or disease. It is important to find the cause of your symptoms. CAUSES   Direct irritation of the stomach lining. This irritation can result from increased acid production (gastroesophageal reflux disease), infection, food poisoning, taking certain medicines (such as nonsteroidal anti-inflammatory drugs), alcohol use, or tobacco use.   Signals from the brain.These signals could be caused by a headache, heat exposure, an inner ear disturbance, increased pressure in the brain from injury, infection, a tumor, or a concussion, pain, emotional stimulus, or metabolic problems.   An obstruction in the gastrointestinal tract (bowel obstruction).   Illnesses such as diabetes, hepatitis, gallbladder problems, appendicitis, kidney problems, cancer, sepsis, atypical symptoms of a heart attack, or eating disorders.   Medical treatments such as chemotherapy and radiation.   Receiving medicine that makes you sleep (general anesthetic) during surgery.  DIAGNOSIS Your caregiver may ask for tests to be done if the problems do not improve after a few days. Tests may also be done if symptoms are severe or if the reason for the nausea and vomiting is not clear. Tests may include:  Urine tests.   Blood tests.   Stool tests.   Cultures (to look for evidence of infection).   X-rays or other imaging studies.  Test results can help your caregiver make decisions about  treatment or the need for additional tests. TREATMENT You need to stay well hydrated. Drink frequently but in small amounts.You may wish to drink water, sports drinks, clear broth, or eat frozen ice pops or gelatin dessert to help stay hydrated.When you eat, eating slowly may help prevent nausea.There are also some antinausea medicines that may help prevent nausea. HOME CARE INSTRUCTIONS   Take all medicine as directed by your caregiver.   If you do not have an appetite, do not force yourself to eat. However, you must continue to drink fluids.   If you have an appetite, eat a normal diet unless your caregiver tells you differently.   Eat a variety of complex carbohydrates (rice, wheat, potatoes, bread), lean meats, yogurt, fruits, and vegetables.   Avoid high-fat foods because they are more difficult to digest.   Drink enough water and fluids to keep your urine clear or pale yellow.   If you are dehydrated, ask your caregiver for specific rehydration instructions. Signs of dehydration may include:   Severe thirst.   Dry lips and mouth.   Dizziness.   Dark urine.   Decreasing urine frequency and amount.   Confusion.   Rapid breathing or pulse.  SEEK IMMEDIATE MEDICAL CARE IF:   You have blood or brown flecks (like coffee grounds) in your vomit.   You have black or bloody stools.   You have a severe headache or stiff neck.   You are confused.   You have severe abdominal pain.   You have chest pain or trouble breathing.   You do not urinate at least once every 8 hours.   You develop cold  or clammy skin.   You continue to vomit for longer than 24 to 48 hours.   You have a fever.  MAKE SURE YOU:   Understand these instructions.   Will watch your condition.   Will get help right away if you are not doing well or get worse.  Document Released: 07/19/2005 Document Revised: 07/08/2011 Document Reviewed: 12/16/2010 [facility] Patient Information 2012 [place],  [place].

## 2012-01-20 NOTE — ED Notes (Signed)
Pt presents with multiple c/o, left hip and leg pain since Tuesday, N/V/D since Monday evening. Pt is alert, oriented x 4. Skin warm and dry. Pt states took trash out Monday night and has had leg/hip pain since. Pt denies fall. Pt reports lt hip replacement 10 yrs ago, denies problems/difficulty with said hip. Cap refill brisk with warm toes bilaterally. Pt refers to N/V/D as multiple times since visiting the nursing home on Monday afternoon.  No v/d noted at this time.

## 2012-03-24 ENCOUNTER — Emergency Department (HOSPITAL_COMMUNITY): Payer: Medicare Other

## 2012-03-24 ENCOUNTER — Encounter (HOSPITAL_COMMUNITY): Payer: Self-pay | Admitting: *Deleted

## 2012-03-24 ENCOUNTER — Emergency Department (HOSPITAL_COMMUNITY)
Admission: EM | Admit: 2012-03-24 | Discharge: 2012-03-24 | Disposition: A | Discharge status: Home / Self Care | Payer: Medicare Other | Attending: Emergency Medicine | Admitting: Emergency Medicine

## 2012-03-24 DIAGNOSIS — R197 Diarrhea, unspecified: Secondary | ICD-10-CM | POA: Insufficient documentation

## 2012-03-24 DIAGNOSIS — Z9089 Acquired absence of other organs: Secondary | ICD-10-CM | POA: Insufficient documentation

## 2012-03-24 DIAGNOSIS — N183 Chronic kidney disease, stage 3 unspecified: Secondary | ICD-10-CM | POA: Insufficient documentation

## 2012-03-24 DIAGNOSIS — E039 Hypothyroidism, unspecified: Secondary | ICD-10-CM | POA: Insufficient documentation

## 2012-03-24 DIAGNOSIS — R109 Unspecified abdominal pain: Secondary | ICD-10-CM

## 2012-03-24 DIAGNOSIS — I129 Hypertensive chronic kidney disease with stage 1 through stage 4 chronic kidney disease, or unspecified chronic kidney disease: Secondary | ICD-10-CM | POA: Insufficient documentation

## 2012-03-24 DIAGNOSIS — E669 Obesity, unspecified: Secondary | ICD-10-CM | POA: Insufficient documentation

## 2012-03-24 DIAGNOSIS — Z79899 Other long term (current) drug therapy: Secondary | ICD-10-CM | POA: Insufficient documentation

## 2012-03-24 DIAGNOSIS — R1032 Left lower quadrant pain: Secondary | ICD-10-CM | POA: Insufficient documentation

## 2012-03-24 DIAGNOSIS — R112 Nausea with vomiting, unspecified: Secondary | ICD-10-CM

## 2012-03-24 LAB — CBC WITH DIFFERENTIAL/PLATELET
Basophils Absolute: 0 10*3/uL (ref 0.0–0.1)
Basophils Relative: 0 % (ref 0–1)
Eosinophils Absolute: 0 10*3/uL (ref 0.0–0.7)
Eosinophils Relative: 0 % (ref 0–5)
HCT: 35.5 % — ABNORMAL LOW (ref 36.0–46.0)
Hemoglobin: 11.8 g/dL — ABNORMAL LOW (ref 12.0–15.0)
Lymphocytes Relative: 24 % (ref 12–46)
Lymphs Abs: 1.1 10*3/uL (ref 0.7–4.0)
MCH: 30.2 pg (ref 26.0–34.0)
MCHC: 33.2 g/dL (ref 30.0–36.0)
MCV: 90.8 fL (ref 78.0–100.0)
Monocytes Absolute: 0.2 10*3/uL (ref 0.1–1.0)
Monocytes Relative: 5 % (ref 3–12)
Neutro Abs: 3.2 10*3/uL (ref 1.7–7.7)
Neutrophils Relative %: 70 % (ref 43–77)
Platelets: 163 10*3/uL (ref 150–400)
RBC: 3.91 MIL/uL (ref 3.87–5.11)
RDW: 12.3 % (ref 11.5–15.5)
WBC: 4.5 10*3/uL (ref 4.0–10.5)

## 2012-03-24 LAB — BASIC METABOLIC PANEL
BUN: 17 mg/dL (ref 6–23)
CO2: 27 mEq/L (ref 19–32)
Calcium: 10.1 mg/dL (ref 8.4–10.5)
Chloride: 100 mEq/L (ref 96–112)
Creatinine, Ser: 1.35 mg/dL — ABNORMAL HIGH (ref 0.50–1.10)
GFR calc Af Amer: 42 mL/min — ABNORMAL LOW (ref >90)
GFR calc non Af Amer: 36 mL/min — ABNORMAL LOW (ref >90)
Glucose, Bld: 115 mg/dL — ABNORMAL HIGH (ref 70–99)
Potassium: 3.4 mEq/L — ABNORMAL LOW (ref 3.5–5.1)
Sodium: 137 mEq/L (ref 135–145)

## 2012-03-24 LAB — URINALYSIS, ROUTINE W REFLEX MICROSCOPIC
Bilirubin Urine: NEGATIVE
Glucose, UA: NEGATIVE mg/dL
Ketones, ur: NEGATIVE mg/dL
Leukocytes, UA: NEGATIVE
Nitrite: NEGATIVE
Protein, ur: NEGATIVE mg/dL
Specific Gravity, Urine: 1.01 (ref 1.005–1.030)
Urobilinogen, UA: 0.2 mg/dL (ref 0.0–1.0)
pH: 6 (ref 5.0–8.0)

## 2012-03-24 LAB — URINE MICROSCOPIC-ADD ON

## 2012-03-24 LAB — LACTIC ACID, PLASMA: Lactic Acid, Venous: 1.4 mmol/L (ref 0.5–2.2)

## 2012-03-24 MED ORDER — SODIUM CHLORIDE 0.9 % IV BOLUS (SEPSIS)
1000.0000 mL | Freq: Once | INTRAVENOUS | Status: AC
  Administered 2012-03-24: 1000 mL via INTRAVENOUS

## 2012-03-24 MED ORDER — MORPHINE SULFATE 4 MG/ML IJ SOLN
4.0000 mg | Freq: Once | INTRAMUSCULAR | Status: AC
  Administered 2012-03-24: 4 mg via INTRAVENOUS
  Filled 2012-03-24: qty 1

## 2012-03-24 MED ORDER — ONDANSETRON HCL 4 MG PO TABS: 4.0000 mg | ORAL_TABLET | Freq: Four times a day (QID) | ORAL | Status: AC

## 2012-03-24 MED ORDER — IOHEXOL 300 MG/ML  SOLN
80.0000 mL | Freq: Once | INTRAMUSCULAR | Status: AC | PRN
  Administered 2012-03-24: 80 mL via INTRAVENOUS

## 2012-03-24 MED ORDER — ONDANSETRON HCL 4 MG/2ML IJ SOLN
4.0000 mg | Freq: Once | INTRAMUSCULAR | Status: AC
  Administered 2012-03-24: 4 mg via INTRAVENOUS
  Filled 2012-03-24: qty 2

## 2012-03-24 NOTE — ED Notes (Signed)
Pt returned from ct scan, assisted to bedside commode, pt tolerated well, update given on plan of care.

## 2012-03-24 NOTE — ED Notes (Signed)
Pt c/o left lower quad abd pain that radiates down left leg, n/v/d, chills,  urinary frequency that started yesterday,

## 2012-03-24 NOTE — ED Notes (Signed)
  Pt transported to ct 

## 2012-03-24 NOTE — ED Notes (Signed)
Pt states that the pain is starting to return, Dr. Juleen China notified, additional orders given

## 2012-03-24 NOTE — ED Provider Notes (Signed)
History   This chart was scribed for Tammy Razor, MD by Toya Smothers. The patient was seen in room APA04/APA04. Patient's care was started at 0808.  CSN: [id number]  Arrival date & time 03/24/12  0808   First MD Initiated Contact with Patient 03/24/12 [phone]      Chief Complaint  Patient presents with  . Urinary Frequency  . Abdominal Pain  . Nausea  . Diarrhea  . Emesis   HPI  Tammy Sims is a 78 y.o. female with a past medical history of chronic kidney disease who presents to the EDt complaining of LLQ abdominal pain onset 1 days ago. Pain represents a significant deviation from baseline health and has worsened over the past  few hours. Pain is described as sharp, moderate and constat. Pt endorses associated chills, urinary frequency, decreased urinary output, nausea, diarrhea, and emesis occuring secondary to pain. She reports that she had began taking penicillin after a dental procedure 2 days ago. Pt denies fever, back pain, and blood in stool.  Past Medical History  Diagnosis Date  . Hypertension     Lab: 08/2011 Normal BMet except creatinine of 1.3Stress nuclear in 10/2002; no definite abnormality, normal EF  . Obesity   . Depression   . Hypothyroidism   . Degenerative joint disease     Left THA in 2004  . Upper GI bleed 2004    NSAID- induced; gastritis, multiple gastric ulcers and duodenal ulcers  . Hematochezia 2002    hemorrhoids, rectal polyp  . Anemia     H&H of 11.3/33.6 in 08/2011 with normal MCV  . Chronic kidney disease, stage 3, mod decreased GFR     Creatinine of 1.3 in 08/2011  . Thrombocytopenia     Platelets of 116,000 in 08/2011  . Arrhythmia     PACs, sinus bradycardia, sinus arrhythmia sinus pauses    Past Surgical History  Procedure Date  . Total hip arthroplasty 05/2003    Left  . Cholecystectomy 10/2010    Laparoscopic  . Abdominal hysterectomy     plus unilateral oophorectomy, fibroids  . Cataract extraction, bilateral   . Colonoscopy  2002    Rectal polyp and hemorrhoids    Family History  Problem Relation Age of Onset  . Hypertension Mother     History  Substance Use Topics  . Smoking status: Never Smoker   . Smokeless tobacco: Never Used  . Alcohol Use: No   Review of Systems  Constitutional: Positive for chills. Negative for fatigue.  Gastrointestinal: Positive for nausea, vomiting, abdominal pain and diarrhea.  Genitourinary: Positive for frequency and decreased urine volume.  All other systems reviewed and are negative.    Allergies  Review of patient's allergies indicates no known allergies.  Home Medications   Current Outpatient Rx  Name Route Sig Dispense Refill  . CALCIUM + D PO Oral Take 1 tablet by mouth daily.      . FUROSEMIDE 40 MG PO TABS Oral Take 20 mg by mouth daily.     Marland Kitchen HYDROCHLOROTHIAZIDE 25 MG PO TABS Oral Take 12.5 mg by mouth daily.    Marland Kitchen HYDROCODONE-ACETAMINOPHEN 5-500 MG PO TABS Oral Take 1 tablet by mouth as needed. For shoulder pain    . HYPROMELLOSE 2.5 % OP SOLN Both Eyes Place 1 drop into both eyes as needed. Dry eyes    . LEVOTHYROXINE SODIUM 25 MCG PO TABS Oral Take 25 mcg by mouth daily.     Marland Kitchen LORAZEPAM 1 MG  PO TABS Oral Take 1 mg by mouth at bedtime.      Marland Kitchen LOSARTAN POTASSIUM-HCTZ 100-12.5 MG PO TABS Oral Take 1 tablet by mouth daily.      Marland Kitchen PENICILLIN V POTASSIUM 500 MG PO TABS Oral Take 500 mg by mouth 4 (four) times daily. For 7 days, Started on Wednesday 03/22/12    . VITAMIN B-12 1000 MCG PO TABS Oral Take 1,000 mcg by mouth daily.        BP 166/76  Pulse 76  Temp 98.4 F (36.9 C)  Resp 20  Ht 5\' 1"  (1.549 m)  Wt 199 lb (90.266 kg)  BMI 37.60 kg/m2  SpO2 97%  Physical Exam  Nursing note and vitals reviewed. Constitutional: She is oriented to person, place, and time. She appears well-developed and well-nourished. No distress.       Awake, alert, elderly appearance.  HENT:  Head: Atraumatic.  Eyes: Right eye exhibits no discharge. Left eye exhibits no  discharge.  Neck: Neck supple.  Pulmonary/Chest: Effort normal. She exhibits no tenderness.  Abdominal: Soft. There is no tenderness. There is no rebound.       Well healed surgical scars. Tenderness LLQ. No hernia palpated.  Musculoskeletal: She exhibits no tenderness.       Baseline ROM, no obvious new focal weakness.  Neurological: She is alert and oriented to person, place, and time. No cranial nerve deficit. Coordination normal.  Skin: No rash noted. She is not diaphoretic.  Psychiatric: She has a normal mood and affect.    ED Course  Procedures (including critical care time) DIAGNOSTIC STUDIES: Oxygen Saturation is 97% on room air, normal by my interpretation.    COORDINATION OF CARE: 0820- Evaluated Pt. Pt is without distress, awake, alert, and oriented. 1610- CT Abdomen Pelvis W Contrast 1 time imaging.  9604- Ordered Basic metabolic panel STAT. [id number]- Ordered Urinalysis, Routine w reflex microscopic Once. 5409- Ordered Lactic acid, plasma STAT. 0845- Ordered morphine 4 MG/ML injection 4 mg Once. 0845- Ordered ondansetron (ZOFRAN) injection 4 mg Once. 0845- Ordered sodium chloride 0.9 % bolus 1,000 mL Once.  Labs Reviewed  CBC WITH DIFFERENTIAL - Abnormal; Notable for the following:    Hemoglobin 11.8 (*)     HCT 35.5 (*)     All other components within normal limits  BASIC METABOLIC PANEL - Abnormal; Notable for the following:    Potassium 3.4 (*)     Glucose, Bld 115 (*)     Creatinine, Ser 1.35 (*)     GFR calc non Af Amer 36 (*)     GFR calc Af Amer 42 (*)     All other components within normal limits  URINALYSIS, ROUTINE W REFLEX MICROSCOPIC - Abnormal; Notable for the following:    Hgb urine dipstick TRACE (*)     All other components within normal limits  LACTIC ACID, PLASMA  URINE MICROSCOPIC-ADD ON   Ct Abdomen Pelvis W Contrast  03/24/2012  *RADIOLOGY REPORT*  Clinical Data: Left lower quadrant pain  CT ABDOMEN AND PELVIS WITH CONTRAST  Technique:   Multidetector CT imaging of the abdomen and pelvis was performed following the standard protocol during bolus administration of intravenous contrast.  Contrast: 80mL OMNIPAQUE IOHEXOL 300 MG/ML  SOLN  Comparison: 10/20/2010  Findings: No focal abnormalities seen in the liver or spleen.  The stomach, duodenum, and adrenal glands are unremarkable. Gallbladder is surgically absent.  No evidence for pancreatic mass. The main pancreatic duct is prominent, but remains stable.  Kidneys  are unremarkable.  No abdominal aortic aneurysm.  No free fluid or lymphadenopathy in the abdomen.  Abdominal bowel loops are unremarkable.  Imaging through the pelvis has fine detail of the lower pelvis obscured by the streak artifact emanating from the patient's left hip prosthesis.  No gross free fluid in the pelvis.  Visualized portions of the bladder are unremarkable.  Uterus is surgically absent.  No evidence for adnexal mass.  No colonic diverticulitis.  The terminal ileum and the appendix are normal.  Bone windows show degenerative changes in the right hip.  There is lower lumbar facet osteoarthritis with grade 1 spondylolisthesis at L4-5.  IMPRESSION: No acute findings in the abdomen or pelvis.  Specifically, no evidence to explain the patient's history of left lower quadrant pain.  No evidence for colonic diverticulitis.  No lymphadenopathy or adnexal mass in the pelvis.   Original Report Authenticated By: ERIC A. MANSELL, M.D.      1. Abdominal pain   2. Nausea and vomiting       MDM  82yf with LLQ pain. Suspect viral illness. Ct neg for acute pathology. Labs fairly unremarkable. Afebrile and HD stable. Only mild tenderness on exam. Plan continued symptomatic tx at this time. Return precautions discussed with pt and daughter. Outpt fu.  I personally preformed the services scribed in my presence. The recorded information has been reviewed and considered. Tammy Razor, MD.     Tammy Razor, MD 03/24/12 [phone]

## 2012-06-02 ENCOUNTER — Other Ambulatory Visit (HOSPITAL_COMMUNITY): Payer: Self-pay | Admitting: Family Medicine

## 2012-06-02 DIAGNOSIS — Z139 Encounter for screening, unspecified: Secondary | ICD-10-CM

## 2012-07-03 ENCOUNTER — Ambulatory Visit (HOSPITAL_COMMUNITY)
Admission: RE | Admit: 2012-07-03 | Discharge: 2012-07-03 | Disposition: A | Discharge status: Home / Self Care | Payer: Medicare Other | Source: Ambulatory Visit | Attending: Family Medicine | Admitting: Family Medicine

## 2012-07-03 DIAGNOSIS — Z139 Encounter for screening, unspecified: Secondary | ICD-10-CM

## 2012-07-03 DIAGNOSIS — R922 Inconclusive mammogram: Secondary | ICD-10-CM | POA: Insufficient documentation

## 2012-07-07 ENCOUNTER — Other Ambulatory Visit: Payer: Self-pay | Admitting: Family Medicine

## 2012-07-07 DIAGNOSIS — R928 Other abnormal and inconclusive findings on diagnostic imaging of breast: Secondary | ICD-10-CM

## 2012-07-18 ENCOUNTER — Encounter (HOSPITAL_COMMUNITY): Payer: Medicare Other | Attending: Oncology

## 2012-07-18 DIAGNOSIS — D72829 Elevated white blood cell count, unspecified: Secondary | ICD-10-CM | POA: Insufficient documentation

## 2012-07-18 DIAGNOSIS — D649 Anemia, unspecified: Secondary | ICD-10-CM | POA: Insufficient documentation

## 2012-07-18 DIAGNOSIS — D61818 Other pancytopenia: Secondary | ICD-10-CM | POA: Insufficient documentation

## 2012-07-18 DIAGNOSIS — D696 Thrombocytopenia, unspecified: Secondary | ICD-10-CM | POA: Insufficient documentation

## 2012-07-18 LAB — CBC
MCV: 94.3 fL (ref 78.0–100.0)
Platelets: 144 10*3/uL — ABNORMAL LOW (ref 150–400)
RDW: 13.2 % (ref 11.5–15.5)
WBC: 4.2 10*3/uL (ref 4.0–10.5)

## 2012-07-18 LAB — DIFFERENTIAL
Basophils Absolute: 0 10*3/uL (ref 0.0–0.1)
Basophils Relative: 1 % (ref 0–1)
Eosinophils Relative: 5 % (ref 0–5)
Lymphocytes Relative: 39 % (ref 12–46)

## 2012-07-18 NOTE — Progress Notes (Signed)
Labs drawn today for cbc/diff 

## 2012-07-19 ENCOUNTER — Encounter (HOSPITAL_COMMUNITY): Payer: Medicare Other

## 2012-07-20 ENCOUNTER — Ambulatory Visit (HOSPITAL_COMMUNITY)
Admission: RE | Admit: 2012-07-20 | Discharge: 2012-07-20 | Disposition: A | Discharge status: Home / Self Care | Payer: Medicare Other | Source: Ambulatory Visit | Attending: Family Medicine | Admitting: Family Medicine

## 2012-07-20 ENCOUNTER — Encounter (HOSPITAL_BASED_OUTPATIENT_CLINIC_OR_DEPARTMENT_OTHER): Payer: Medicare Other | Admitting: Oncology

## 2012-07-20 ENCOUNTER — Encounter (HOSPITAL_COMMUNITY): Payer: Self-pay | Admitting: Oncology

## 2012-07-20 VITALS — BP 183/83 | HR 83 | Temp 98.1°F | Resp 18 | Wt 196.5 lb

## 2012-07-20 DIAGNOSIS — D61818 Other pancytopenia: Secondary | ICD-10-CM

## 2012-07-20 DIAGNOSIS — D649 Anemia, unspecified: Secondary | ICD-10-CM

## 2012-07-20 DIAGNOSIS — R928 Other abnormal and inconclusive findings on diagnostic imaging of breast: Secondary | ICD-10-CM | POA: Insufficient documentation

## 2012-07-20 DIAGNOSIS — D696 Thrombocytopenia, unspecified: Secondary | ICD-10-CM

## 2012-07-20 HISTORY — DX: Other pancytopenia: D61.818

## 2012-07-20 NOTE — Patient Instructions (Signed)
[facility] Discharge Instructions  RECOMMENDATIONS MADE BY THE CONSULTANT AND ANY TEST RESULTS WILL BE SENT TO YOUR REFERRING PHYSICIAN.  EXAM FINDINGS BY THE PHYSICIAN TODAY AND SIGNS OR SYMPTOMS TO REPORT TO CLINIC OR PRIMARY PHYSICIAN: Exam findings as discussed by T. Kefalas, PA-C.  SPECIAL INSTRUCTIONS/FOLLOW-UP: 1.  You're being scheduled for labs and to see Dr. Mariel Sleet 6 months from now.  Contact us sooner with questions or concerns.  Thank you for choosing [facility] to provide your oncology and hematology care.  To afford each patient quality time with our providers, please arrive at least 15 minutes before your scheduled appointment time.  With your help, our goal is to use those 15 minutes to complete the necessary work-up to ensure our physicians have the information they need to help with your evaluation and healthcare recommendations.    Effective January 1st, 2014, we ask that you re-schedule your appointment with our physicians should you arrive 10 or more minutes late for your appointment.  We strive to give you quality time with our providers, and arriving late affects you and other patients whose appointments are after yours.    Again, thank you for choosing [facility].  Our hope is that these requests will decrease the amount of time that you wait before being seen by our physicians.       _____________________________________________________________  I acknowledge that I have been informed and understand all the instructions given to me and received a copy. I do not have anymore questions at this time but understand that I may call the Cancer Center at [facility] at [phone] during business hours should I have any further questions or need assistance in obtaining follow-up care.    __________________________________________  _____________  __________ Signature of Patient or Authorized Representative            Date                    Time    __________________________________________ Nurse's Signature

## 2012-07-20 NOTE — Progress Notes (Signed)
[name], MD [address] [place] [address]  1. Pancytopenia     CURRENT THERAPY: Observation  INTERVAL HISTORY: Tammy Sims 78 y.o. female returns for  regular  visit for followup of stable pancytopenia.  I printed a copy of the patient's lab work for her to go home with and review.  I personally reviewed and went over laboratory results with the patient.  Her WBC count is WNL and she is mildly anemic and thrombocytopenic.  MMA, Folate, Vit B 12, and Homocysteine are WNL.    She denies any B symptoms and complaints.  She denies any requirements for antibiotics in the past 6 months.    She had a mammogram at the beginning of this month and it was a Bi-Rads 0 so she is having this repeated today at 11 AM.  As a result of this repeat mammogram, the patient is concerned and anxious.  Her blood pressure is thus increased.  At the end of our appointment visit, I rechecked her BP and it was 175/87.    She reports that she had a nice Thanksgiving and she does not have any solidified plans for Christmas.   Complete ROS questioning is negative.   She did have some left hip/thigh pain and this was evaluated by orthopod in [place].  It was discovered that the pain was arthritic in nature.   She reports, "Now that I have turned 78 years old, everything is falling apart."  On a positive note, the patient's labs remain stable so we will check again in 6 months and have her return in 6 months.   Past Medical History  Diagnosis Date  . Hypertension     Lab: 08/2011 Normal BMet except creatinine of 1.3Stress nuclear in 10/2002; no definite abnormality, normal EF  . Obesity   . Depression   . Hypothyroidism   . Degenerative joint disease     Left THA in 2004  . Upper GI bleed 2004    NSAID- induced; gastritis, multiple gastric ulcers and duodenal ulcers  . Hematochezia 2002    hemorrhoids, rectal polyp  . Anemia     H&H of 11.3/33.6 in 08/2011 with normal  MCV  . Chronic kidney disease, stage 3, mod decreased GFR     Creatinine of 1.3 in 08/2011  . Thrombocytopenia     Platelets of 116,000 in 08/2011  . Arrhythmia     PACs, sinus bradycardia, sinus arrhythmia sinus pauses  . Pancytopenia 07/20/2012    has HYPOTHYROIDISM; Hypertension; Obesity; Chronic kidney disease, stage 3, mod decreased GFR; Thrombocytopenia; Anemia; Arrhythmia; and Pancytopenia on her problem list.      has no known allergies.  Ms. Kampf had no medications administered during this visit.  Past Surgical History  Procedure Date  . Total hip arthroplasty 05/2003    Left  . Cholecystectomy 10/2010    Laparoscopic  . Abdominal hysterectomy     plus unilateral oophorectomy, fibroids  . Cataract extraction, bilateral   . Colonoscopy 2002    Rectal polyp and hemorrhoids    Denies any headaches, dizziness, double vision, fevers, chills, night sweats, nausea, vomiting, diarrhea, constipation, chest pain, heart palpitations, shortness of breath, blood in stool, black tarry stool, urinary pain, urinary burning, urinary frequency, hematuria.   PHYSICAL EXAMINATION  ECOG PERFORMANCE STATUS: 1 - Symptomatic but completely ambulatory  Filed Vitals:   07/20/12 0900  BP: 183/83  Pulse: 83  Temp: 98.1 F (36.7 C)  Resp: 18  GENERAL:alert, no distress, well nourished, well developed, comfortable, cooperative, obese and smiling SKIN: skin color, texture, turgor are normal, no rashes or significant lesions HEAD: Normocephalic, No masses, lesions, tenderness or abnormalities EYES: normal, Conjunctiva are pink and non-injected EARS: External ears normal OROPHARYNX:mucous membranes are moist  NECK: supple, no adenopathy, thyroid normal size, non-tender, without nodularity, no stridor, non-tender, trachea midline LYMPH:  no palpable lymphadenopathy BREAST:not examined LUNGS: clear to auscultation  HEART: regular rate & rhythm, no murmurs, no gallops, S1 normal and S2  normal ABDOMEN:abdomen soft, non-tender, obese and normal bowel sounds BACK: Back symmetric, no curvature., No CVA tenderness EXTREMITIES:less then 2 second capillary refill, no joint deformities, effusion, or inflammation, no edema, no skin discoloration, no clubbing, no cyanosis  NEURO: alert & oriented x 3 with fluent speech, no focal motor/sensory deficits, gait normal    LABORATORY DATA: CBC    Component Value Date/Time   WBC 4.2 07/18/2012 0900   RBC 3.51* 07/18/2012 0900   HGB 10.7* 07/18/2012 0900   HCT 33.1* 07/18/2012 0900   PLT 144* 07/18/2012 0900   MCV 94.3 07/18/2012 0900   MCH 30.5 07/18/2012 0900   MCHC 32.3 07/18/2012 0900   RDW 13.2 07/18/2012 0900   LYMPHSABS 1.7 07/18/2012 0900   MONOABS 0.4 07/18/2012 0900   EOSABS 0.2 07/18/2012 0900   BASOSABS 0.0 07/18/2012 0900      Chemistry      Component Value Date/Time   NA 137 03/24/2012 0900   K 3.4* 03/24/2012 0900   CL 100 03/24/2012 0900   CO2 27 03/24/2012 0900   BUN 17 03/24/2012 0900   CREATININE 1.35* 03/24/2012 0900      Component Value Date/Time   CALCIUM 10.1 03/24/2012 0900   ALKPHOS 92 01/20/2012 1336   AST 23 01/20/2012 1336   ALT 12 01/20/2012 1336   BILITOT 0.4 01/20/2012 1336        ASSESSMENT:  1. Anemia 2. Thrombocytopenia 3. Leukopenia, intermittent 4. Pancytopenia, stable.    PLAN:  1. I personally reviewed and went over laboratory results with the patient. 2. Labs in 6 months: CBC diff, CMET 3. She is scheduled for a mammogram later this am at 11 AM. 4. Return in 6 months for follow-up.  We would be happy to see the patient sooner if needed.    All questions were answered. The patient knows to call the clinic with any problems, questions or concerns. We can certainly see the patient much sooner if necessary.  Patient and plan will be discussed with Dr. Mariel Sleet in the near future.   Emad Brechtel

## 2013-01-17 ENCOUNTER — Ambulatory Visit (HOSPITAL_COMMUNITY): Payer: Medicare Other

## 2013-01-17 ENCOUNTER — Other Ambulatory Visit (HOSPITAL_COMMUNITY): Payer: Medicare Other

## 2013-01-18 ENCOUNTER — Other Ambulatory Visit (HOSPITAL_COMMUNITY): Payer: Medicare Other

## 2013-02-09 ENCOUNTER — Encounter (HOSPITAL_COMMUNITY): Payer: Self-pay | Admitting: Oncology

## 2013-02-09 ENCOUNTER — Encounter (HOSPITAL_COMMUNITY): Payer: Medicare Other

## 2013-02-09 ENCOUNTER — Encounter (HOSPITAL_COMMUNITY): Payer: Medicare Other | Attending: Oncology | Admitting: Oncology

## 2013-02-09 VITALS — BP 133/71 | HR 71 | Temp 97.8°F | Resp 16 | Wt 193.9 lb

## 2013-02-09 DIAGNOSIS — D696 Thrombocytopenia, unspecified: Secondary | ICD-10-CM

## 2013-02-09 DIAGNOSIS — D649 Anemia, unspecified: Secondary | ICD-10-CM | POA: Insufficient documentation

## 2013-02-09 DIAGNOSIS — D61818 Other pancytopenia: Secondary | ICD-10-CM

## 2013-02-09 LAB — COMPREHENSIVE METABOLIC PANEL
AST: 19 U/L (ref 0–37)
Albumin: 3.5 g/dL (ref 3.5–5.2)
Alkaline Phosphatase: 98 U/L (ref 39–117)
BUN: 21 mg/dL (ref 6–23)
Chloride: 104 mEq/L (ref 96–112)
Potassium: 3.8 mEq/L (ref 3.5–5.1)
Total Bilirubin: 0.4 mg/dL (ref 0.3–1.2)

## 2013-02-09 LAB — CBC WITH DIFFERENTIAL/PLATELET
Basophils Absolute: 0 10*3/uL (ref 0.0–0.1)
Basophils Relative: 0 % (ref 0–1)
Hemoglobin: 11 g/dL — ABNORMAL LOW (ref 12.0–15.0)
Lymphocytes Relative: 36 % (ref 12–46)
MCHC: 32.9 g/dL (ref 30.0–36.0)
Monocytes Relative: 11 % (ref 3–12)
Neutro Abs: 1.7 10*3/uL (ref 1.7–7.7)
Neutrophils Relative %: 51 % (ref 43–77)
WBC: 3.3 10*3/uL — ABNORMAL LOW (ref 4.0–10.5)

## 2013-02-09 NOTE — Progress Notes (Signed)
Tammy Sims's reason for visit today are for office visit and labs as scheduled per MD orders.  Venipuncture performed with a 23 gauge butterfly needle to L Antecubital.  Tammy Sims tolerated venipuncture well and without incident; questions were answered and patient was discharged.

## 2013-02-09 NOTE — Patient Instructions (Signed)
[facility] Discharge Instructions  RECOMMENDATIONS MADE BY THE CONSULTANT AND ANY TEST RESULTS WILL BE SENT TO YOUR REFERRING PHYSICIAN.  EXAM FINDINGS BY THE PHYSICIAN TODAY AND SIGNS OR SYMPTOMS TO REPORT TO CLINIC OR PRIMARY PHYSICIAN: Exam findings as discussed by T. Kefalas, PA-C.  SPECIAL INSTRUCTIONS/FOLLOW-UP: 1. You are released from the clinic - we are here should you have questions or concerns in the future. 2. Mammograms as scheduled. 3. Follow-up with Dr. Sudie Bailey as planned.  Thank you for choosing [facility] to provide your oncology and hematology care.  To afford each patient quality time with our providers, please arrive at least 15 minutes before your scheduled appointment time.  With your help, our goal is to use those 15 minutes to complete the necessary work-up to ensure our physicians have the information they need to help with your evaluation and healthcare recommendations.    Effective January 1st, 2014, we ask that you re-schedule your appointment with our physicians should you arrive 10 or more minutes late for your appointment.  We strive to give you quality time with our providers, and arriving late affects you and other patients whose appointments are after yours.    Again, thank you for choosing [facility].  Our hope is that these requests will decrease the amount of time that you wait before being seen by our physicians.       _____________________________________________________________  Should you have questions after your visit to [facility], please contact our office at [phone] between the hours of 8:30 a.m. and 5:00 p.m.  Voicemails left after 4:30 p.m. will not be returned until the following business day.  For prescription refill requests, have your pharmacy contact our office with your prescription refill request.

## 2013-02-09 NOTE — Progress Notes (Signed)
Tammy Obey, MD [address] [place] [address]  Anemia  Thrombocytopenia  CURRENT THERAPY:Observation   INTERVAL HISTORY: Tammy Sims 79 y.o. female returns for  regular  visit for followup of stable pancytopenia.   Labs from her last encounter in December of 2013 shows a white blood cell count 4.2, hemoglobin 10.7 g/dL, and poorly count 629,528. Lab work has been stable since at least 2008.  Last performed today on 02/09/2013 shows a white blood cell count 3.3, hemoglobin 11.0, and platelet preliminary count is 110,000.  I personally reviewed and went over radiographic studies with the patient.  Mammogram performed in December 2013 reveal a BI-RADS category 1 which required a right diagnostic mammogram which was negative. It is recommended that she continue with yearly screening mammograms. She'll be due for this December of 2014.  Her labs and the stable since 2008 indication she's pancytopenic but more commonly, she is anemic with a mild thrombocytopenia. No interventions have been required at this point in time and therefore, we'll establish stability of her labs and we will release her from the clinic to followup with her primary care physician. We would be on her to see the patient future if necessary.  The patient reminded of her next mammogram being due in December 2014.  Hematologically, the patient denies any complaints. She denies any B. symptoms including fevers, chills, night sweats, decreased appetite, and unintentional weight loss. She denies any easy bruisability or bleeding. She denies any rashes.  Past Medical History  Diagnosis Date  . Hypertension     Lab: 08/2011 Normal BMet except creatinine of 1.3Stress nuclear in 10/2002; no definite abnormality, normal EF  . Obesity   . Depression   . Hypothyroidism   . Degenerative joint disease     Left THA in 2004  . Upper GI bleed 2004    NSAID- induced; gastritis, multiple gastric  ulcers and duodenal ulcers  . Hematochezia 2002    hemorrhoids, rectal polyp  . Anemia     H&H of 11.3/33.6 in 08/2011 with normal MCV  . Chronic kidney disease, stage 3, mod decreased GFR     Creatinine of 1.3 in 08/2011  . Thrombocytopenia     Platelets of 116,000 in 08/2011  . Arrhythmia     PACs, sinus bradycardia, sinus arrhythmia sinus pauses  . Pancytopenia 07/20/2012    has HYPOTHYROIDISM; Hypertension; Obesity; Chronic kidney disease, stage 3, mod decreased GFR; Thrombocytopenia; Anemia; Arrhythmia; and Pancytopenia on her problem list.     has No Known Allergies.  Ms. Almon had no medications administered during this visit.  Past Surgical History  Procedure Laterality Date  . Total hip arthroplasty  05/2003    Left  . Cholecystectomy  10/2010    Laparoscopic  . Abdominal hysterectomy      plus unilateral oophorectomy, fibroids  . Cataract extraction, bilateral    . Colonoscopy  2002    Rectal polyp and hemorrhoids    Denies any headaches, dizziness, double vision, fevers, chills, night sweats, nausea, vomiting, diarrhea, constipation, chest pain, heart palpitations, shortness of breath, blood in stool, black tarry stool, urinary pain, urinary burning, urinary frequency, hematuria.   PHYSICAL EXAMINATION  ECOG PERFORMANCE STATUS: 0 - Asymptomatic  Filed Vitals:   02/09/13 0913  BP: 133/71  Pulse: 71  Temp: 97.8 F (36.6 C)  Resp: 16    GENERAL:alert, no distress, well nourished, well developed, comfortable, cooperative, obese and smiling SKIN: skin color,  texture, turgor are normal, no rashes or significant lesions HEAD: Normocephalic, No masses, lesions, tenderness or abnormalities EYES: normal, PERRLA, EOMI, Conjunctiva are pink and non-injected EARS: External ears normal OROPHARYNX:no exudate, no erythema, lips, buccal mucosa, and tongue normal and mucous membranes are moist  NECK: supple, no adenopathy, thyroid normal size, non-tender, without  nodularity, no stridor, non-tender, trachea midline LYMPH:  no palpable lymphadenopathy, no hepatosplenomegaly BREAST:not examined LUNGS: clear to auscultation and percussion HEART: regular rate & rhythm, no murmurs, no gallops, S1 normal and S2 normal ABDOMEN:abdomen soft, non-tender, obese, normal bowel sounds, no masses or organomegaly and no hepatosplenomegaly BACK: Back symmetric, no curvature., No CVA tenderness EXTREMITIES:less then 2 second capillary refill, no joint deformities, effusion, or inflammation, no edema, no skin discoloration, no clubbing, no cyanosis  NEURO: alert & oriented x 3 with fluent speech, no focal motor/sensory deficits, gait normal    LABORATORY DATA: CBC    Component Value Date/Time   WBC 3.3* 02/09/2013 0848   RBC 3.55* 02/09/2013 0848   HGB 11.0* 02/09/2013 0848   HCT 33.4* 02/09/2013 0848   PLT PENDING 02/09/2013 0848   MCV 94.1 02/09/2013 0848   MCH 31.0 02/09/2013 0848   MCHC 32.9 02/09/2013 0848   RDW 12.7 02/09/2013 0848   LYMPHSABS 1.2 02/09/2013 0848   MONOABS 0.4 02/09/2013 0848   EOSABS 0.1 02/09/2013 0848   BASOSABS 0.0 02/09/2013 0848      Chemistry      Component Value Date/Time   NA 139 02/09/2013 0848   K 3.8 02/09/2013 0848   CL 104 02/09/2013 0848   CO2 28 02/09/2013 0848   BUN 21 02/09/2013 0848   CREATININE 1.26* 02/09/2013 0848      Component Value Date/Time   CALCIUM 9.4 02/09/2013 0848   ALKPHOS 98 02/09/2013 0848   AST 19 02/09/2013 0848   ALT 11 02/09/2013 0848   BILITOT 0.4 02/09/2013 0848     Lab Results  Component Value Date   FERRITIN 163 02/24/2007   Lab Results  Component Value Date   FOLATE >20.0 01/17/2012   Lab Results  Component Value Date   VITAMINB12 627 01/17/2012      PENDING LABS: Platelet count   RADIOGRAPHIC STUDIES:  07/20/2012  *RADIOLOGY REPORT*  Clinical Data: The patient returns for evaluation of a possible  mass in the right breast noted on recent screening study dated  07/03/2012.    DIGITAL DIAGNOSTIC RIGHT MAMMOGRAM  Comparison: 07/02/2011, 06/29/2010, 06/25/2009  Findings:  ACR Breast Density Category 1: The breast tissue is almost entirely  fatty.  Additional views demonstrate no persistent mass or distortion in  the right subareolar region.  IMPRESSION:  No persistent worrisome abnormality upon additional imaging of the  right breast.  RECOMMENDATION:  Yearly screening mammography is suggested.  I have discussed the findings and recommendations with the patient.  Results were also provided in writing at the conclusion of the  visit.  BI-RADS CATEGORY 1: Negative.  Original Report Authenticated By: Cain Saupe, M.D.  07/04/2013  *RADIOLOGY REPORT*  Clinical Data: Screening.  DIGITAL BILATERAL SCREENING MAMMOGRAM WITH CAD  Comparison: Previous exams.  Findings: Two views of each breast demonstrate scattered  fibroglandular tissue. In the right breast, a possible mass  warrants further evaluation with spot compression views and  possibly ultrasound. In the left breast, no masses or malignant  type calcifications are identified.  Images were processed with CAD.  IMPRESSION:  Further evaluation is suggested for possible mass in the right  breast.  RECOMMENDATION:  Diagnostic mammogram and possibly ultrasound of the right breast.  (Code:FI-R-34M)  BI-RADS CATEGORY 0: Incomplete. Need additional imaging  evaluation and/or prior mammograms for comparison.  Original Report Authenticated By: Rolla Plate, M.D.     ASSESSMENT:  1. Normocytic anemia, stable, dating back to at least 2008 without any changes. 2. Thrombocytopenia, stable, dating back to at least 2008 without any changes. She ranges between 163 and 99 with her lowest being 99,000 on 06/16/2009 3. Leukopenia, occasionally, stable. Ranging between 3.01 02/06/2007 to as high as 6.7 on 10/20/2010. No recurrent infections or increased need for antibiotics 4. Occasional  pancytopenia.  Patient Active Problem List   Diagnosis Date Noted  . Pancytopenia 07/20/2012  . Hypertension   . Obesity   . Chronic kidney disease, stage 3, mod decreased GFR   . Thrombocytopenia   . Anemia   . Arrhythmia   . HYPOTHYROIDISM 03/01/2007      PLAN:  1. I personally reviewed and went over laboratory results with the patient. 2. I personally reviewed and went over radiographic studies with the patient. 3. Next screening mammogram will be due December of 2014. She was kindly reminded of this. 4. Release from clinic.   THERAPY PLAN:  We have established stability of her laboratory work over the past 6 years at least. As a result, we will release the patient the clinic as no intervention has been required over the past 6 years. She will follow her primary care physician as directed. We will be happy to see the patient in the future if referral is required.  All questions were answered. The patient knows to call the clinic with any problems, questions or concerns. We can certainly see the patient much sooner if necessary.  Patient and plan discussed with Dr. Gerarda Fraction and he is in agreement with the aforementioned.  KEFALAS,THOMAS

## 2013-02-19 ENCOUNTER — Ambulatory Visit (HOSPITAL_COMMUNITY): Payer: Medicare Other | Admitting: Oncology

## 2013-02-19 ENCOUNTER — Other Ambulatory Visit (HOSPITAL_COMMUNITY): Payer: Medicare Other

## 2013-06-18 ENCOUNTER — Other Ambulatory Visit (HOSPITAL_COMMUNITY): Payer: Self-pay | Admitting: Family Medicine

## 2013-06-18 DIAGNOSIS — Z139 Encounter for screening, unspecified: Secondary | ICD-10-CM

## 2013-07-05 ENCOUNTER — Ambulatory Visit (HOSPITAL_COMMUNITY)
Admission: RE | Admit: 2013-07-05 | Discharge: 2013-07-05 | Disposition: A | Discharge status: Home / Self Care | Payer: Medicare Other | Source: Ambulatory Visit | Attending: Family Medicine | Admitting: Family Medicine

## 2013-07-05 DIAGNOSIS — Z1231 Encounter for screening mammogram for malignant neoplasm of breast: Secondary | ICD-10-CM | POA: Insufficient documentation

## 2013-07-05 DIAGNOSIS — Z139 Encounter for screening, unspecified: Secondary | ICD-10-CM

## 2013-09-20 ENCOUNTER — Encounter: Payer: Self-pay | Admitting: Gastroenterology

## 2013-09-20 ENCOUNTER — Other Ambulatory Visit: Payer: Self-pay | Admitting: Internal Medicine

## 2013-09-20 ENCOUNTER — Other Ambulatory Visit: Payer: Self-pay | Admitting: Gastroenterology

## 2013-09-20 ENCOUNTER — Ambulatory Visit (INDEPENDENT_AMBULATORY_CARE_PROVIDER_SITE_OTHER): Payer: Medicare Other | Admitting: Gastroenterology

## 2013-09-20 ENCOUNTER — Encounter (INDEPENDENT_AMBULATORY_CARE_PROVIDER_SITE_OTHER): Payer: Self-pay

## 2013-09-20 VITALS — BP 184/94 | HR 81 | Temp 97.6°F | Wt 189.6 lb

## 2013-09-20 DIAGNOSIS — R195 Other fecal abnormalities: Secondary | ICD-10-CM

## 2013-09-20 DIAGNOSIS — D649 Anemia, unspecified: Secondary | ICD-10-CM

## 2013-09-20 MED ORDER — PEG 3350-KCL-NA BICARB-NACL 420 G PO SOLR: 4000.0000 mL | ORAL | Status: DC

## 2013-09-20 NOTE — Patient Instructions (Signed)
We have scheduled you for a colonoscopy with Dr. Jena Gaussourk in the near future.  Further recommendations to follow shortly!

## 2013-09-20 NOTE — Assessment & Plan Note (Signed)
80 year old healthy female with heme positive stool but no overt signs of GI bleeding; she has no concerning lower or upper GI symptoms currently. Last colonoscopy in 2004 by Dr. Katrinka BlazingSmith was normal; last EGD in remote past with NSAID-induced gastritis, ulcers. Chronic anemia noted and at baseline; most recent iron studies normal. No concerns for iron deficiency.   Due to heme positive stool, will proceed with screening colonoscopy with Dr. Jena Gaussourk. Hold off on EGD as no signs of IDA or upper GI symptoms. Risks and benefits discussed in detail with stated understanding.

## 2013-09-20 NOTE — Assessment & Plan Note (Addendum)
Chronic. Followed by hematology in the past. No evidence of IDA currently. Likely due to chronic disease.

## 2013-09-20 NOTE — Progress Notes (Signed)
Primary Care Physician:  [name], MD Primary Gastroenterologist:  Dr. Jena Gauss   Chief Complaint  Patient presents with  . Rectal Bleeding    HPI:   Tammy Sims presents today at the request of Dr. Sudie Bailey secondary to heme positive stool. She has a history of chronic anemia, pancytopenia, followed by hematology in the past. Last seen July 2014 by Hematology. Recent labs from PCP reveal normal iron, ferritin, and TIBC. Hgb 10.8 in Nov 2014, which seems to be around her baseline. No change in bowel habits, no rectal bleeding, melena. No abdominal pain. No reflux or dysphagia. Good appetite. No upper or lower GI symptoms. Last colonoscopy by Dr. Katrinka Blazing in 2004.    Past Medical History  Diagnosis Date  . Hypertension     Lab: 08/2011 Normal BMet except creatinine of 1.3Stress nuclear in 10/2002; no definite abnormality, normal EF  . Obesity   . Depression   . Hypothyroidism   . Degenerative joint disease     Left THA in 2004  . Upper GI bleed 2004    NSAID- induced; gastritis, multiple gastric ulcers and duodenal ulcers  . Hematochezia 2002    hemorrhoids, rectal polyp  . Anemia     H&H of 11.3/33.6 in 08/2011 with normal MCV  . Chronic kidney disease, stage 3, mod decreased GFR     Creatinine of 1.3 in 08/2011  . Thrombocytopenia     Platelets of 116,000 in 08/2011  . Arrhythmia     PACs, sinus bradycardia, sinus arrhythmia sinus pauses  . Pancytopenia 07/20/2012    Past Surgical History  Procedure Laterality Date  . Total hip arthroplasty  05/2003    Left  . Cholecystectomy  10/2010    Laparoscopic  . Abdominal hysterectomy      plus unilateral oophorectomy, fibroids  . Cataract extraction, bilateral    . Colonoscopy  2002    Rectal polyp and hemorrhoids  . Colonoscopy   01/25/2003      Smith:no evidence of polyps/no evidence of diverticulosis    Current Outpatient Prescriptions  Medication Sig Dispense Refill  . cloNIDine (CATAPRES) 0.2 MG tablet  Take 0.2 mg by mouth 2 (two) times daily.      Marland Kitchen HYDROcodone-acetaminophen (VICODIN) 5-500 MG per tablet Take 1 tablet by mouth as needed. For shoulder pain      . levothyroxine (SYNTHROID, LEVOTHROID) 25 MCG tablet Take 25 mcg by mouth daily.       Marland Kitchen LORazepam (ATIVAN) 1 MG tablet Take 1 mg by mouth at bedtime.        Marland Kitchen losartan-hydrochlorothiazide (HYZAAR) 100-12.5 MG per tablet Take 1 tablet by mouth daily.        . Calcium Carbonate-Vitamin D (CALCIUM + D PO) Take 1 tablet by mouth daily.         No current facility-administered medications for this visit.    Allergies as of 09/20/2013  . (No Known Allergies)    Family History  Problem Relation Age of Onset  . Hypertension Mother   . Colon cancer Neg Hx     History   Social History  . Marital Status: Widowed    Spouse Name: N/A    Number of Children: N/A  . Years of Education: N/A   Occupational History  . Homemaker    Social History Main Topics  . Smoking status: Never Smoker   . Smokeless tobacco: Never Used  . Alcohol Use: No  . Drug Use: No  .  Sexual Activity: No   Other Topics Concern  . Not on file   Social History Narrative   Daughter is a Engineer, civil (consulting)nurse at [employer]    Review of Systems: Gen: Denies any fever, chills, fatigue, weight loss, lack of appetite.  CV: Denies chest pain, heart palpitations, peripheral edema, syncope.  Resp: Denies shortness of breath at rest or with exertion. Denies wheezing or cough.  GI: see HPI GU : Denies urinary burning, urinary frequency, urinary hesitancy MS: +arthritis  Derm: Denies rash, itching, dry skin Psych: +depression/anxiety Heme: Denies bruising, bleeding, and enlarged lymph nodes.  Physical Exam: BP 184/94  Pulse 81  Temp(Src) 97.6 F (36.4 C) (Oral)  Wt 189 lb 9.6 oz (86.002 kg) General:   Alert and oriented. Pleasant and cooperative. Well-nourished and well-developed.  Head:  Normocephalic and atraumatic. Eyes:  Without icterus, sclera clear and  conjunctiva pink.  Ears:  Normal auditory acuity. Nose:  No deformity, discharge,  or lesions. Mouth:  No deformity or lesions, oral mucosa pink.  Neck:  Supple, without mass or thyromegaly. Lungs:  Clear to auscultation bilaterally. No wheezes, rales, or rhonchi. No distress.  Heart:  S1, S2 present without murmurs appreciated.  Abdomen:  +BS, soft, non-tender and non-distended. No HSM noted. No guarding or rebound. No masses appreciated.  Rectal:  Deferred  Msk:  Symmetrical without gross deformities. Normal posture. Extremities:  Without clubbing or edema. Neurologic:  Alert and  oriented x4;  grossly normal neurologically. Skin:  Intact without significant lesions or rashes. Cervical Nodes:  No significant cervical adenopathy. Psych:  Alert and cooperative. Normal mood and affect.

## 2013-09-21 ENCOUNTER — Encounter (HOSPITAL_COMMUNITY): Payer: Self-pay | Admitting: Pharmacy Technician

## 2013-09-26 NOTE — Progress Notes (Signed)
cc'd to pcp 

## 2013-10-04 ENCOUNTER — Encounter (HOSPITAL_COMMUNITY): Payer: Self-pay

## 2013-10-04 ENCOUNTER — Encounter (HOSPITAL_COMMUNITY)
Admission: RE | Disposition: A | Discharge status: Home / Self Care | Payer: Self-pay | Source: Ambulatory Visit | Attending: Internal Medicine

## 2013-10-04 ENCOUNTER — Ambulatory Visit (HOSPITAL_COMMUNITY)
Admission: RE | Admit: 2013-10-04 | Discharge: 2013-10-04 | Disposition: A | Discharge status: Home / Self Care | Payer: Medicare Other | Source: Ambulatory Visit | Attending: Internal Medicine | Admitting: Internal Medicine

## 2013-10-04 DIAGNOSIS — D61818 Other pancytopenia: Secondary | ICD-10-CM | POA: Insufficient documentation

## 2013-10-04 DIAGNOSIS — E669 Obesity, unspecified: Secondary | ICD-10-CM | POA: Insufficient documentation

## 2013-10-04 DIAGNOSIS — F3289 Other specified depressive episodes: Secondary | ICD-10-CM | POA: Insufficient documentation

## 2013-10-04 DIAGNOSIS — R195 Other fecal abnormalities: Secondary | ICD-10-CM

## 2013-10-04 DIAGNOSIS — Z79899 Other long term (current) drug therapy: Secondary | ICD-10-CM | POA: Insufficient documentation

## 2013-10-04 DIAGNOSIS — N183 Chronic kidney disease, stage 3 unspecified: Secondary | ICD-10-CM | POA: Insufficient documentation

## 2013-10-04 DIAGNOSIS — I129 Hypertensive chronic kidney disease with stage 1 through stage 4 chronic kidney disease, or unspecified chronic kidney disease: Secondary | ICD-10-CM | POA: Insufficient documentation

## 2013-10-04 DIAGNOSIS — D509 Iron deficiency anemia, unspecified: Secondary | ICD-10-CM | POA: Insufficient documentation

## 2013-10-04 DIAGNOSIS — K921 Melena: Secondary | ICD-10-CM | POA: Insufficient documentation

## 2013-10-04 DIAGNOSIS — F329 Major depressive disorder, single episode, unspecified: Secondary | ICD-10-CM | POA: Insufficient documentation

## 2013-10-04 HISTORY — PX: COLONOSCOPY: SHX5424

## 2013-10-04 SURGERY — COLONOSCOPY
Anesthesia: Moderate Sedation

## 2013-10-04 MED ORDER — MIDAZOLAM HCL 5 MG/5ML IJ SOLN
INTRAMUSCULAR | Status: DC | PRN
  Administered 2013-10-04: 2 mg via INTRAVENOUS
  Administered 2013-10-04 (×2): 1 mg via INTRAVENOUS

## 2013-10-04 MED ORDER — SODIUM CHLORIDE 0.9 % IV SOLN
INTRAVENOUS | Status: DC | PRN
  Administered 2013-10-04: 1000 mL via INTRAMUSCULAR

## 2013-10-04 MED ORDER — ONDANSETRON HCL 4 MG/2ML IJ SOLN
INTRAMUSCULAR | Status: AC
  Filled 2013-10-04: qty 2

## 2013-10-04 MED ORDER — STERILE WATER FOR IRRIGATION IR SOLN
Status: DC | PRN
  Administered 2013-10-04: 08:00:00

## 2013-10-04 MED ORDER — MIDAZOLAM HCL 5 MG/5ML IJ SOLN
INTRAMUSCULAR | Status: AC
  Filled 2013-10-04: qty 10

## 2013-10-04 MED ORDER — MEPERIDINE HCL 100 MG/ML IJ SOLN
INTRAMUSCULAR | Status: DC | PRN
  Administered 2013-10-04 (×2): 25 mg via INTRAVENOUS

## 2013-10-04 MED ORDER — SODIUM CHLORIDE 0.9 % IV SOLN: INTRAVENOUS | Status: DC

## 2013-10-04 MED ORDER — MEPERIDINE HCL 100 MG/ML IJ SOLN
INTRAMUSCULAR | Status: AC
  Filled 2013-10-04: qty 2

## 2013-10-04 MED ORDER — ONDANSETRON HCL 4 MG/2ML IJ SOLN
INTRAMUSCULAR | Status: DC | PRN
  Administered 2013-10-04: 4 mg via INTRAVENOUS

## 2013-10-04 NOTE — Interval H&P Note (Signed)
History and Physical Interval Note:  10/04/2013 8:12 AM  Tammy Sims  has presented today for surgery, with the diagnosis of HEME POSITIVE STOOL  The various methods of treatment have been discussed with the patient and family. After consideration of risks, benefits and other options for treatment, the patient has consented to  Procedure(s) with comments: COLONOSCOPY (N/A) - 8:00 as a surgical intervention .  The patient's history has been reviewed, patient examined, no change in status, stable for surgery.  I have reviewed the patient's chart and labs.  Questions were answered to the patient's satisfaction.     No change. Colonoscopy per plan.The risks, benefits, limitations, alternatives and imponderables have been reviewed with the patient. Questions have been answered. All parties are agreeable.   Eula Listenobert Esgar Barnick

## 2013-10-04 NOTE — H&P (View-Only) (Signed)
Primary Care Physician:  [name], MD Primary Gastroenterologist:  Dr. Jena Gauss   Chief Complaint  Patient presents with  . Rectal Bleeding    HPI:   Tammy Sims presents today at the request of Dr. Sudie Bailey secondary to heme positive stool. She has a history of chronic anemia, pancytopenia, followed by hematology in the past. Last seen July 2014 by Hematology. Recent labs from PCP reveal normal iron, ferritin, and TIBC. Hgb 10.8 in Nov 2014, which seems to be around her baseline. No change in bowel habits, no rectal bleeding, melena. No abdominal pain. No reflux or dysphagia. Good appetite. No upper or lower GI symptoms. Last colonoscopy by Dr. Katrinka Blazing in 2004.    Past Medical History  Diagnosis Date  . Hypertension     Lab: 08/2011 Normal BMet except creatinine of 1.3Stress nuclear in 10/2002; no definite abnormality, normal EF  . Obesity   . Depression   . Hypothyroidism   . Degenerative joint disease     Left THA in 2004  . Upper GI bleed 2004    NSAID- induced; gastritis, multiple gastric ulcers and duodenal ulcers  . Hematochezia 2002    hemorrhoids, rectal polyp  . Anemia     H&H of 11.3/33.6 in 08/2011 with normal MCV  . Chronic kidney disease, stage 3, mod decreased GFR     Creatinine of 1.3 in 08/2011  . Thrombocytopenia     Platelets of 116,000 in 08/2011  . Arrhythmia     PACs, sinus bradycardia, sinus arrhythmia sinus pauses  . Pancytopenia 07/20/2012    Past Surgical History  Procedure Laterality Date  . Total hip arthroplasty  05/2003    Left  . Cholecystectomy  10/2010    Laparoscopic  . Abdominal hysterectomy      plus unilateral oophorectomy, fibroids  . Cataract extraction, bilateral    . Colonoscopy  2002    Rectal polyp and hemorrhoids  . Colonoscopy   01/25/2003      Smith:no evidence of polyps/no evidence of diverticulosis    Current Outpatient Prescriptions  Medication Sig Dispense Refill  . cloNIDine (CATAPRES) 0.2 MG tablet  Take 0.2 mg by mouth 2 (two) times daily.      Marland Kitchen HYDROcodone-acetaminophen (VICODIN) 5-500 MG per tablet Take 1 tablet by mouth as needed. For shoulder pain      . levothyroxine (SYNTHROID, LEVOTHROID) 25 MCG tablet Take 25 mcg by mouth daily.       Marland Kitchen LORazepam (ATIVAN) 1 MG tablet Take 1 mg by mouth at bedtime.        Marland Kitchen losartan-hydrochlorothiazide (HYZAAR) 100-12.5 MG per tablet Take 1 tablet by mouth daily.        . Calcium Carbonate-Vitamin D (CALCIUM + D PO) Take 1 tablet by mouth daily.         No current facility-administered medications for this visit.    Allergies as of 09/20/2013  . (No Known Allergies)    Family History  Problem Relation Age of Onset  . Hypertension Mother   . Colon cancer Neg Hx     History   Social History  . Marital Status: Widowed    Spouse Name: N/A    Number of Children: N/A  . Years of Education: N/A   Occupational History  . Homemaker    Social History Main Topics  . Smoking status: Never Smoker   . Smokeless tobacco: Never Used  . Alcohol Use: No  . Drug Use: No  .  Sexual Activity: No   Other Topics Concern  . Not on file   Social History Narrative   Daughter is a Engineer, civil (consulting)nurse at [employer]    Review of Systems: Gen: Denies any fever, chills, fatigue, weight loss, lack of appetite.  CV: Denies chest pain, heart palpitations, peripheral edema, syncope.  Resp: Denies shortness of breath at rest or with exertion. Denies wheezing or cough.  GI: see HPI GU : Denies urinary burning, urinary frequency, urinary hesitancy MS: +arthritis  Derm: Denies rash, itching, dry skin Psych: +depression/anxiety Heme: Denies bruising, bleeding, and enlarged lymph nodes.  Physical Exam: BP 184/94  Pulse 81  Temp(Src) 97.6 F (36.4 C) (Oral)  Wt 189 lb 9.6 oz (86.002 kg) General:   Alert and oriented. Pleasant and cooperative. Well-nourished and well-developed.  Head:  Normocephalic and atraumatic. Eyes:  Without icterus, sclera clear and  conjunctiva pink.  Ears:  Normal auditory acuity. Nose:  No deformity, discharge,  or lesions. Mouth:  No deformity or lesions, oral mucosa pink.  Neck:  Supple, without mass or thyromegaly. Lungs:  Clear to auscultation bilaterally. No wheezes, rales, or rhonchi. No distress.  Heart:  S1, S2 present without murmurs appreciated.  Abdomen:  +BS, soft, non-tender and non-distended. No HSM noted. No guarding or rebound. No masses appreciated.  Rectal:  Deferred  Msk:  Symmetrical without gross deformities. Normal posture. Extremities:  Without clubbing or edema. Neurologic:  Alert and  oriented x4;  grossly normal neurologically. Skin:  Intact without significant lesions or rashes. Cervical Nodes:  No significant cervical adenopathy. Psych:  Alert and cooperative. Normal mood and affect.

## 2013-10-04 NOTE — Discharge Instructions (Signed)
°  Colonoscopy Discharge Instructions  Read the instructions outlined below and refer to this sheet in the next few weeks. These discharge instructions provide you with general information on caring for yourself after you leave the hospital. Your doctor may also give you specific instructions. While your treatment has been planned according to the most current medical practices available, unavoidable complications occasionally occur. If you have any problems or questions after discharge, call Dr. Jena Gaussourk at [phone]. ACTIVITY  You may resume your regular activity, but move at a slower pace for the next 24 hours.   Take frequent rest periods for the next 24 hours.   Walking will help get rid of the air and reduce the bloated feeling in your belly (abdomen).   No driving for 24 hours (because of the medicine (anesthesia) used during the test).    Do not sign any important legal documents or operate any machinery for 24 hours (because of the anesthesia used during the test).  NUTRITION  Drink plenty of fluids.   You may resume your normal diet as instructed by your doctor.   Begin with a light meal and progress to your normal diet. Heavy or fried foods are harder to digest and may make you feel sick to your stomach (nauseated).   Avoid alcoholic beverages for 24 hours or as instructed.  MEDICATIONS  You may resume your normal medications unless your doctor tells you otherwise.  WHAT YOU CAN EXPECT TODAY  Some feelings of bloating in the abdomen.   Passage of more gas than usual.   Spotting of blood in your stool or on the toilet paper.  IF YOU HAD POLYPS REMOVED DURING THE COLONOSCOPY:  No aspirin products for 7 days or as instructed.   No alcohol for 7 days or as instructed.   Eat a soft diet for the next 24 hours.  FINDING OUT THE RESULTS OF YOUR TEST Not all test results are available during your visit. If your test results are not back during the visit, make an appointment  with your caregiver to find out the results. Do not assume everything is normal if you have not heard from your caregiver or the medical facility. It is important for you to follow up on all of your test results.  SEEK IMMEDIATE MEDICAL ATTENTION IF:  You have more than a spotting of blood in your stool.   Your belly is swollen (abdominal distention).   You are nauseated or vomiting.   You have a temperature over 101.   You have abdominal pain or discomfort that is severe or gets worse throughout the day.     Followup with your primary care physician as directed

## 2013-10-08 ENCOUNTER — Encounter (HOSPITAL_COMMUNITY): Payer: Self-pay | Admitting: Internal Medicine

## 2013-10-15 NOTE — Op Note (Addendum)
[facility] [address] [place], [id number]   COLONOSCOPY PROCEDURE REPORT  PATIENT: [name], Tammy C.  MR#:         [id number] BIRTHDATE: [birth year] , 83  yrs. old GENDER: Female ENDOSCOPIST: R.  Roetta SessionsMichael Helmuth Recupero, MD FACP FACG REFERRED BY:  Gareth MorganSteve Knowlton, M.D. PROCEDURE DATE:  10/04/2013 PROCEDURE:     Diagnostic ileocolonoscopy  INDICATIONS: Hemoccult-positive stool; chronic anemia  INFORMED CONSENT:  The risks, benefits, alternatives and imponderables including but not limited to bleeding, perforation as well as the possibility of a missed lesion have been reviewed.  The potential for biopsy, lesion removal, etc. have also been discussed.  Questions have been answered.  All parties agreeable. Please see the history and physical in the medical record for more information.  MEDICATIONS: Versed 5 mg IV and Demerol 50 mg IV in divided doses. Zofran 4 mg IV.  DESCRIPTION OF PROCEDURE:  After a digital rectal exam was performed, the EC-[id number] ([id number]  colonoscope was advanced from the anus through the rectum and colon to the area of the cecum, ileocecal valve and appendiceal orifice.  The cecum was deeply intubated.  These structures were well-seen and photographed for the record.  From the level of the cecum and ileocecal valve, the scope was slowly and cautiously withdrawn.  The mucosal surfaces were carefully surveyed utilizing scope tip deflection to facilitate fold flattening as needed.  The scope was pulled down into the rectum where a thorough examination including retroflexion was performed.    FINDINGS:  Adequate preparation. Normal rectum aside from friable anal canal.  Normal-appearing colonic mucosa.   Normal-appearing distal 5 cm of terminal ileal mucosa.  THERAPEUTIC / DIAGNOSTIC MANEUVERS PERFORMED:  None  COMPLICATIONS: none  CECAL WITHDRAWAL TIME:  9 minutes  IMPRESSION:  Normal ileocolonoscopy  RECOMMENDATIONS: Followup with Dr.  Sudie BaileyKnowlton. I do not recommend any future colonoscopy or further GI evaluation at this time so long as patient does not have any new symptoms. Discussed at length with patient's daughter.   _______________________________ eSigned:  R. Roetta SessionsMichael Jerimy Johanson, MD FACP [facility] 10/15/2013 1:08 PM   CC:

## 2013-11-24 ENCOUNTER — Encounter (HOSPITAL_COMMUNITY): Payer: Self-pay | Admitting: Emergency Medicine

## 2013-11-24 ENCOUNTER — Emergency Department (HOSPITAL_COMMUNITY)
Admission: EM | Admit: 2013-11-24 | Discharge: 2013-11-24 | Disposition: A | Discharge status: Home / Self Care | Payer: Medicare Other | Attending: Emergency Medicine | Admitting: Emergency Medicine

## 2013-11-24 DIAGNOSIS — N183 Chronic kidney disease, stage 3 unspecified: Secondary | ICD-10-CM | POA: Insufficient documentation

## 2013-11-24 DIAGNOSIS — Z862 Personal history of diseases of the blood and blood-forming organs and certain disorders involving the immune mechanism: Secondary | ICD-10-CM | POA: Insufficient documentation

## 2013-11-24 DIAGNOSIS — E669 Obesity, unspecified: Secondary | ICD-10-CM | POA: Insufficient documentation

## 2013-11-24 DIAGNOSIS — M549 Dorsalgia, unspecified: Secondary | ICD-10-CM | POA: Insufficient documentation

## 2013-11-24 DIAGNOSIS — Z8719 Personal history of other diseases of the digestive system: Secondary | ICD-10-CM | POA: Insufficient documentation

## 2013-11-24 DIAGNOSIS — Z79899 Other long term (current) drug therapy: Secondary | ICD-10-CM | POA: Insufficient documentation

## 2013-11-24 DIAGNOSIS — F329 Major depressive disorder, single episode, unspecified: Secondary | ICD-10-CM | POA: Insufficient documentation

## 2013-11-24 DIAGNOSIS — E039 Hypothyroidism, unspecified: Secondary | ICD-10-CM | POA: Insufficient documentation

## 2013-11-24 DIAGNOSIS — Z8739 Personal history of other diseases of the musculoskeletal system and connective tissue: Secondary | ICD-10-CM | POA: Insufficient documentation

## 2013-11-24 DIAGNOSIS — F3289 Other specified depressive episodes: Secondary | ICD-10-CM | POA: Insufficient documentation

## 2013-11-24 DIAGNOSIS — I129 Hypertensive chronic kidney disease with stage 1 through stage 4 chronic kidney disease, or unspecified chronic kidney disease: Secondary | ICD-10-CM | POA: Insufficient documentation

## 2013-11-24 DIAGNOSIS — M5430 Sciatica, unspecified side: Secondary | ICD-10-CM | POA: Insufficient documentation

## 2013-11-24 DIAGNOSIS — F411 Generalized anxiety disorder: Secondary | ICD-10-CM | POA: Insufficient documentation

## 2013-11-24 MED ORDER — DIAZEPAM 5 MG PO TABS: 5.0000 mg | ORAL_TABLET | Freq: Three times a day (TID) | ORAL | Status: DC | PRN

## 2013-11-24 MED ORDER — DIAZEPAM 5 MG PO TABS
5.0000 mg | ORAL_TABLET | Freq: Once | ORAL | Status: AC
  Administered 2013-11-24: 5 mg via ORAL
  Filled 2013-11-24: qty 1

## 2013-11-24 MED ORDER — PREDNISONE 50 MG PO TABS
60.0000 mg | ORAL_TABLET | Freq: Once | ORAL | Status: AC
  Administered 2013-11-24: 60 mg via ORAL
  Filled 2013-11-24 (×2): qty 1

## 2013-11-24 MED ORDER — PREDNISONE 20 MG PO TABS: 20.0000 mg | ORAL_TABLET | Freq: Two times a day (BID) | ORAL | Status: DC

## 2013-11-24 NOTE — Discharge Instructions (Signed)
Use heat on the sore area 3 times a day as needed to help your discomfort. See her doctor for a checkup in 3 days, sooner if needed.  Sciatica Sciatica is pain, weakness, numbness, or tingling along the path of the sciatic nerve. The nerve starts in the lower back and runs down the back of each leg. The nerve controls the muscles in the lower leg and in the back of the knee, while also providing sensation to the back of the thigh, lower leg, and the sole of your foot. Sciatica is a symptom of another medical condition. For instance, nerve damage or certain conditions, such as a herniated disk or bone spur on the spine, pinch or put pressure on the sciatic nerve. This causes the pain, weakness, or other sensations normally associated with sciatica. Generally, sciatica only affects one side of the body. CAUSES   Herniated or slipped disc.  Degenerative disk disease.  A pain disorder involving the narrow muscle in the buttocks (piriformis syndrome).  Pelvic injury or fracture.  Pregnancy.  Tumor (rare). SYMPTOMS  Symptoms can vary from mild to very severe. The symptoms usually travel from the low back to the buttocks and down the back of the leg. Symptoms can include:  Mild tingling or dull aches in the lower back, leg, or hip.  Numbness in the back of the calf or sole of the foot.  Burning sensations in the lower back, leg, or hip.  Sharp pains in the lower back, leg, or hip.  Leg weakness.  Severe back pain inhibiting movement. These symptoms may get worse with coughing, sneezing, laughing, or prolonged sitting or standing. Also, being overweight may worsen symptoms. DIAGNOSIS  Your caregiver will perform a physical exam to look for common symptoms of sciatica. He or she may ask you to do certain movements or activities that would trigger sciatic nerve pain. Other tests may be performed to find the cause of the sciatica. These may include:  Blood tests.  X-rays.  Imaging tests,  such as an MRI or CT scan. TREATMENT  Treatment is directed at the cause of the sciatic pain. Sometimes, treatment is not necessary and the pain and discomfort goes away on its own. If treatment is needed, your caregiver may suggest:  Over-the-counter medicines to relieve pain.  Prescription medicines, such as anti-inflammatory medicine, muscle relaxants, or narcotics.  Applying heat or ice to the painful area.  Steroid injections to lessen pain, irritation, and inflammation around the nerve.  Reducing activity during periods of pain.  Exercising and stretching to strengthen your abdomen and improve flexibility of your spine. Your caregiver may suggest losing weight if the extra weight makes the back pain worse.  Physical therapy.  Surgery to eliminate what is pressing or pinching the nerve, such as a bone spur or part of a herniated disk. HOME CARE INSTRUCTIONS   Only take over-the-counter or prescription medicines for pain or discomfort as directed by your caregiver.  Apply ice to the affected area for 20 minutes, 3 4 times a day for the first 48 72 hours. Then try heat in the same way.  Exercise, stretch, or perform your usual activities if these do not aggravate your pain.  Attend physical therapy sessions as directed by your caregiver.  Keep all follow-up appointments as directed by your caregiver.  Do not wear high heels or shoes that do not provide proper support.  Check your mattress to see if it is too soft. A firm mattress may lessen  your pain and discomfort. SEEK IMMEDIATE MEDICAL CARE IF:   You lose control of your bowel or bladder (incontinence).  You have increasing weakness in the lower back, pelvis, buttocks, or legs.  You have redness or swelling of your back.  You have a burning sensation when you urinate.  You have pain that gets worse when you lie down or awakens you at night.  Your pain is worse than you have experienced in the past.  Your pain is  lasting longer than 4 weeks.  You are suddenly losing weight without reason. MAKE SURE YOU:  Understand these instructions.  Will watch your condition.  Will get help right away if you are not doing well or get worse. Document Released: 07/13/2001 Document Revised: 01/18/2012 Document Reviewed: 11/28/2011 [facility] Patient Information 2014 [employer] [place].

## 2013-11-24 NOTE — ED Provider Notes (Signed)
CSN: [id number]     Arrival date & time 11/24/13  1131 History  This chart was scribed for Flint MelterElliott L Aniesa Boback, MD by Quintella ReichertMatthew Underwood, ED scribe.  This patient was seen in room APA03/APA03 and the patient's care was started at 1:14 PM.   Chief Complaint  Patient presents with  . Leg Pain    The history is provided by the patient. No language interpreter was used.    HPI Comments: Tammy Sims is a 80 y.o. female with h/o HTN, hypothyroidism, and degenerative joint disease who presents to the Emergency Department complaining of right lower back pain radiating to the right lower leg that began 4 days ago.  Pt denies injury or fall.  Pain is worsened by weight-bearing.  She states she is unable to lay on the right side.  She has attempted to treat pain with hydrocodone and Tylenol, without relief.  She denies vomiting or bowel or bladder symptoms.  Pt notes she has been taking hydrocodone to help her sleep for "a long time."  She has not seen her PCP recently.  PCP is Milana ObeyKNOWLTON,STEPHEN D, MD   Past Medical History  Diagnosis Date  . Hypertension     Lab: 08/2011 Normal BMet except creatinine of 1.3Stress nuclear in 10/2002; no definite abnormality, normal EF  . Obesity   . Depression   . Hypothyroidism   . Degenerative joint disease     Left THA in 2004  . Upper GI bleed 2004    NSAID- induced; gastritis, multiple gastric ulcers and duodenal ulcers  . Hematochezia 2002    hemorrhoids, rectal polyp  . Anemia     H&H of 11.3/33.6 in 08/2011 with normal MCV  . Chronic kidney disease, stage 3, mod decreased GFR     Creatinine of 1.3 in 08/2011  . Thrombocytopenia     Platelets of 116,000 in 08/2011  . Arrhythmia     PACs, sinus bradycardia, sinus arrhythmia sinus pauses  . Pancytopenia 07/20/2012    Past Surgical History  Procedure Laterality Date  . Total hip arthroplasty  05/2003    Left  . Cholecystectomy  10/2010    Laparoscopic  . Abdominal hysterectomy      plus unilateral  oophorectomy, fibroids  . Cataract extraction, bilateral    . Colonoscopy  2002    Rectal polyp and hemorrhoids  . Colonoscopy   01/25/2003      Dr Smith:no evidence of polyps/no evidence of diverticulosis  . Colonoscopy N/A 10/04/2013    Procedure: COLONOSCOPY;  Surgeon: Corbin Adeobert M Rourk, MD;  Location: [facility];  Service: Endoscopy;  Laterality: N/A;  8:00    Family History  Problem Relation Age of Onset  . Hypertension Mother   . Colon cancer Neg Hx     History  Substance Use Topics  . Smoking status: Never Smoker   . Smokeless tobacco: Never Used  . Alcohol Use: No    OB History   Grav Para Term Preterm Abortions TAB SAB Ect Mult Living                   Review of Systems  Gastrointestinal: Negative for vomiting and diarrhea.  Genitourinary: Negative for dysuria, urgency, frequency, hematuria and difficulty urinating.  Musculoskeletal: Positive for back pain.  All other systems reviewed and are negative.     Allergies  Review of patient's allergies indicates no known allergies.  Home Medications   Prior to Admission medications   Medication Sig Start Date  End Date Taking? Authorizing Provider  cloNIDine (CATAPRES) 0.2 MG tablet Take 0.2 mg by mouth daily as needed (for high blood pressure levels).    Yes Historical Provider, MD  HYDROcodone-acetaminophen (NORCO/VICODIN) 5-325 MG per tablet Take 1 tablet by mouth daily as needed for moderate pain (for shoulder and leg pain associated with arthritis).   Yes Historical Provider, MD  levothyroxine (SYNTHROID, LEVOTHROID) 25 MCG tablet Take 25 mcg by mouth daily.    Yes Historical Provider, MD  LORazepam (ATIVAN) 1 MG tablet Take 1 mg by mouth at bedtime.     Yes Historical Provider, MD  losartan-hydrochlorothiazide (HYZAAR) 100-12.5 MG per tablet Take 1 tablet by mouth daily.     Yes Historical Provider, MD   BP 182/58  Pulse 54  Temp(Src) 97.5 F (36.4 C)  Resp 20  Ht 5\' 2"  (1.575 m)  Wt 184 lb (83.462 kg)   BMI 33.65 kg/m2  SpO2 100%  Physical Exam  Nursing note and vitals reviewed. Constitutional: She is oriented to person, place, and time. She appears well-developed and well-nourished.  HENT:  Head: Normocephalic and atraumatic.  Eyes: Conjunctivae and EOM are normal. Pupils are equal, round, and reactive to light.  Neck: Normal range of motion and phonation normal. Neck supple.  Cardiovascular: Normal rate, regular rhythm and intact distal pulses.   Pulmonary/Chest: Effort normal and breath sounds normal. She exhibits no tenderness.  Abdominal: Soft. She exhibits no distension. There is no tenderness. There is no guarding.  Musculoskeletal: Normal range of motion. She exhibits no tenderness.  Neurological: She is alert and oriented to person, place, and time. She exhibits normal muscle tone.  Skin: Skin is warm and dry.  Psychiatric: Her mood appears anxious.    ED Course  Procedures (including critical care time)  DIAGNOSTIC STUDIES: Oxygen Saturation is 100% on room air, normal by my interpretation.    COORDINATION OF CARE: 1:20 PM-Informed pt that symptoms are likely due to sciatica.  Discussed treatment plan which includes muscle relaxant, prednisone, and heat with pt at bedside and pt agreed to plan.       MDM   Final diagnoses:  Sciatica    Evaluation is consistent with neuropathy/sciatica. Doubt lumbar radiculopathy, HNP, discitis, or occult fracture.   Nursing Notes Reviewed/ Care Coordinated Applicable Imaging Reviewed Interpretation of Laboratory Data incorporated into ED treatment  The patient appears reasonably screened and/or stabilized for discharge and I doubt any other medical condition or other [facility] requiring further screening, evaluation, or treatment in the ED at this time prior to discharge.  Plan: Home Medications- Prednisone, Valium; Home Treatments- Heat; return here if the recommended treatment, does not improve the symptoms; Recommended follow  up- PCP 3 days    I personally performed the services described in this documentation, which was scribed in my presence. The recorded information has been reviewed and is accurate.     Flint MelterElliott L Taneasha Fuqua, MD 11/24/13 [phone]

## 2013-11-24 NOTE — ED Notes (Signed)
Pain in right leg for the past 4 days, states she is unable to lay on the right side. States entire leg is painful

## 2013-12-17 ENCOUNTER — Ambulatory Visit (HOSPITAL_COMMUNITY)
Admission: RE | Admit: 2013-12-17 | Discharge: 2013-12-17 | Disposition: A | Discharge status: Home / Self Care | Payer: Medicare Other | Source: Ambulatory Visit | Attending: Family Medicine | Admitting: Family Medicine

## 2013-12-17 ENCOUNTER — Other Ambulatory Visit (HOSPITAL_COMMUNITY): Payer: Self-pay | Admitting: Family Medicine

## 2013-12-17 DIAGNOSIS — M79609 Pain in unspecified limb: Secondary | ICD-10-CM

## 2013-12-17 DIAGNOSIS — M51379 Other intervertebral disc degeneration, lumbosacral region without mention of lumbar back pain or lower extremity pain: Secondary | ICD-10-CM | POA: Insufficient documentation

## 2013-12-17 DIAGNOSIS — M5137 Other intervertebral disc degeneration, lumbosacral region: Secondary | ICD-10-CM | POA: Insufficient documentation

## 2013-12-17 DIAGNOSIS — Q762 Congenital spondylolisthesis: Secondary | ICD-10-CM | POA: Insufficient documentation

## 2013-12-17 DIAGNOSIS — Z96649 Presence of unspecified artificial hip joint: Secondary | ICD-10-CM | POA: Insufficient documentation

## 2014-02-15 ENCOUNTER — Ambulatory Visit (HOSPITAL_COMMUNITY)
Admission: RE | Admit: 2014-02-15 | Discharge: 2014-02-15 | Disposition: A | Discharge status: Home / Self Care | Payer: Medicare Other | Source: Ambulatory Visit | Attending: Family Medicine | Admitting: Family Medicine

## 2014-02-15 ENCOUNTER — Other Ambulatory Visit (HOSPITAL_COMMUNITY): Payer: Self-pay | Admitting: Family Medicine

## 2014-02-15 DIAGNOSIS — M5431 Sciatica, right side: Secondary | ICD-10-CM

## 2014-02-15 DIAGNOSIS — Z96649 Presence of unspecified artificial hip joint: Secondary | ICD-10-CM | POA: Insufficient documentation

## 2014-02-15 DIAGNOSIS — M25559 Pain in unspecified hip: Secondary | ICD-10-CM | POA: Insufficient documentation

## 2014-02-19 ENCOUNTER — Other Ambulatory Visit (HOSPITAL_COMMUNITY): Payer: Self-pay | Admitting: Family Medicine

## 2014-02-19 DIAGNOSIS — M5441 Lumbago with sciatica, right side: Secondary | ICD-10-CM

## 2014-02-22 ENCOUNTER — Ambulatory Visit (HOSPITAL_COMMUNITY)
Admission: RE | Admit: 2014-02-22 | Discharge: 2014-02-22 | Disposition: A | Discharge status: Home / Self Care | Payer: Medicare Other | Source: Ambulatory Visit | Attending: Family Medicine | Admitting: Family Medicine

## 2014-02-22 ENCOUNTER — Other Ambulatory Visit (HOSPITAL_COMMUNITY): Payer: Self-pay | Admitting: Family Medicine

## 2014-02-22 DIAGNOSIS — M545 Low back pain, unspecified: Secondary | ICD-10-CM | POA: Insufficient documentation

## 2014-02-22 DIAGNOSIS — M7989 Other specified soft tissue disorders: Secondary | ICD-10-CM | POA: Diagnosis not present

## 2014-02-22 DIAGNOSIS — M48061 Spinal stenosis, lumbar region without neurogenic claudication: Secondary | ICD-10-CM | POA: Insufficient documentation

## 2014-02-22 DIAGNOSIS — M47817 Spondylosis without myelopathy or radiculopathy, lumbosacral region: Secondary | ICD-10-CM | POA: Diagnosis not present

## 2014-02-22 DIAGNOSIS — M5441 Lumbago with sciatica, right side: Secondary | ICD-10-CM

## 2014-02-22 DIAGNOSIS — R209 Unspecified disturbances of skin sensation: Secondary | ICD-10-CM | POA: Diagnosis not present

## 2014-03-14 ENCOUNTER — Other Ambulatory Visit: Payer: Self-pay | Admitting: Family Medicine

## 2014-03-14 DIAGNOSIS — M5430 Sciatica, unspecified side: Secondary | ICD-10-CM

## 2014-05-31 ENCOUNTER — Other Ambulatory Visit (HOSPITAL_COMMUNITY): Payer: Self-pay | Admitting: Family Medicine

## 2014-05-31 DIAGNOSIS — Z1231 Encounter for screening mammogram for malignant neoplasm of breast: Secondary | ICD-10-CM

## 2014-07-10 ENCOUNTER — Ambulatory Visit (HOSPITAL_COMMUNITY): Payer: Medicare Other

## 2014-07-10 ENCOUNTER — Ambulatory Visit (HOSPITAL_COMMUNITY)
Admission: RE | Admit: 2014-07-10 | Discharge: 2014-07-10 | Disposition: A | Discharge status: Home / Self Care | Payer: Medicare Other | Source: Ambulatory Visit | Attending: Family Medicine | Admitting: Family Medicine

## 2014-07-10 DIAGNOSIS — Z1231 Encounter for screening mammogram for malignant neoplasm of breast: Secondary | ICD-10-CM | POA: Insufficient documentation

## 2014-07-11 ENCOUNTER — Ambulatory Visit (HOSPITAL_COMMUNITY): Payer: Medicare Other

## 2014-10-21 ENCOUNTER — Encounter (HOSPITAL_COMMUNITY): Payer: Self-pay

## 2014-10-21 ENCOUNTER — Emergency Department (HOSPITAL_COMMUNITY): Payer: Medicare Other

## 2014-10-21 ENCOUNTER — Emergency Department (HOSPITAL_COMMUNITY)
Admission: EM | Admit: 2014-10-21 | Discharge: 2014-10-21 | Disposition: A | Discharge status: Home / Self Care | Payer: Medicare Other | Attending: Emergency Medicine | Admitting: Emergency Medicine

## 2014-10-21 DIAGNOSIS — J209 Acute bronchitis, unspecified: Secondary | ICD-10-CM | POA: Insufficient documentation

## 2014-10-21 DIAGNOSIS — Z79899 Other long term (current) drug therapy: Secondary | ICD-10-CM | POA: Diagnosis not present

## 2014-10-21 DIAGNOSIS — R05 Cough: Secondary | ICD-10-CM | POA: Diagnosis present

## 2014-10-21 DIAGNOSIS — Z7952 Long term (current) use of systemic steroids: Secondary | ICD-10-CM | POA: Insufficient documentation

## 2014-10-21 DIAGNOSIS — F329 Major depressive disorder, single episode, unspecified: Secondary | ICD-10-CM | POA: Insufficient documentation

## 2014-10-21 DIAGNOSIS — Z862 Personal history of diseases of the blood and blood-forming organs and certain disorders involving the immune mechanism: Secondary | ICD-10-CM | POA: Insufficient documentation

## 2014-10-21 DIAGNOSIS — N183 Chronic kidney disease, stage 3 (moderate): Secondary | ICD-10-CM | POA: Insufficient documentation

## 2014-10-21 DIAGNOSIS — E039 Hypothyroidism, unspecified: Secondary | ICD-10-CM | POA: Insufficient documentation

## 2014-10-21 DIAGNOSIS — Z8719 Personal history of other diseases of the digestive system: Secondary | ICD-10-CM | POA: Insufficient documentation

## 2014-10-21 DIAGNOSIS — J40 Bronchitis, not specified as acute or chronic: Secondary | ICD-10-CM

## 2014-10-21 DIAGNOSIS — I129 Hypertensive chronic kidney disease with stage 1 through stage 4 chronic kidney disease, or unspecified chronic kidney disease: Secondary | ICD-10-CM | POA: Diagnosis not present

## 2014-10-21 DIAGNOSIS — E669 Obesity, unspecified: Secondary | ICD-10-CM | POA: Insufficient documentation

## 2014-10-21 LAB — CBG MONITORING, ED: GLUCOSE-CAPILLARY: 92 mg/dL (ref 70–99)

## 2014-10-21 MED ORDER — AZITHROMYCIN 250 MG PO TABS: ORAL_TABLET | ORAL | Status: DC

## 2014-10-21 NOTE — Discharge Instructions (Signed)
Follow up with your md in 2-3 days for recheck °

## 2014-10-21 NOTE — ED Notes (Signed)
Cough and cold symptoms for 2 weeks per pt.

## 2014-10-21 NOTE — ED Provider Notes (Signed)
CSN: [id number]     Arrival date & time 10/21/14  4098 History  This chart was scribed for Tammy Berkshire, MD by Ronney Lion, ED Scribe. This patient was seen in room APA07/APA07 and the patient's care was started at 7:53 AM.    Chief Complaint  Patient presents with  . URI    Patient is a 81 y.o. female presenting with URI. The history is provided by the patient. No language interpreter was used.  URI Presenting symptoms: cough   Severity:  Moderate Onset quality:  Gradual Duration:  2 weeks Timing:  Constant Progression:  Unchanged Chronicity:  New Relieved by:  Nothing Worsened by:  Nothing tried Ineffective treatments:  None tried Associated symptoms: no headaches   Risk factors: being elderly   Risk factors: no sick contacts      HPI Comments: Tammy Sims is a 81 y.o. female who presents to the Emergency Department complaining of constant cold symptoms, including a cough, that has been ongoing since last week. She denies any known sick contact.  Past Medical History  Diagnosis Date  . Hypertension     Lab: 08/2011 Normal BMet except creatinine of 1.3Stress nuclear in 10/2002; no definite abnormality, normal EF  . Obesity   . Depression   . Hypothyroidism   . Degenerative joint disease     Left THA in 2004  . Upper GI bleed 2004    NSAID- induced; gastritis, multiple gastric ulcers and duodenal ulcers  . Hematochezia 2002    hemorrhoids, rectal polyp  . Anemia     H&H of 11.3/33.6 in 08/2011 with normal MCV  . Chronic kidney disease, stage 3, mod decreased GFR     Creatinine of 1.3 in 08/2011  . Thrombocytopenia     Platelets of 116,000 in 08/2011  . Arrhythmia     PACs, sinus bradycardia, sinus arrhythmia sinus pauses  . Pancytopenia 07/20/2012   Past Surgical History  Procedure Laterality Date  . Total hip arthroplasty  05/2003    Left  . Cholecystectomy  10/2010    Laparoscopic  . Abdominal hysterectomy      plus unilateral oophorectomy, fibroids  .  Cataract extraction, bilateral    . Colonoscopy  2002    Rectal polyp and hemorrhoids  . Colonoscopy   01/25/2003      Dr Smith:no evidence of polyps/no evidence of diverticulosis  . Colonoscopy N/A 10/04/2013    Procedure: COLONOSCOPY;  Surgeon: Corbin Ade, MD;  Location: [facility];  Service: Endoscopy;  Laterality: N/A;  8:00   Family History  Problem Relation Age of Onset  . Hypertension Mother   . Colon cancer Neg Hx    History  Substance Use Topics  . Smoking status: Never Smoker   . Smokeless tobacco: Never Used  . Alcohol Use: No   OB History    No data available     Review of Systems  Constitutional: Negative for appetite change.  HENT: Negative for ear discharge and sinus pressure.   Eyes: Negative for discharge.  Respiratory: Positive for cough.   Cardiovascular: Negative for chest pain.  Gastrointestinal: Negative for abdominal pain and diarrhea.  Genitourinary: Negative for frequency and hematuria.  Musculoskeletal: Negative for back pain.  Skin: Negative for rash.  Neurological: Negative for seizures and headaches.  Psychiatric/Behavioral: Negative for hallucinations.      Allergies  Review of patient's allergies indicates no known allergies.  Home Medications   Prior to Admission medications   Medication Sig  Start Date End Date Taking? Authorizing Provider  cloNIDine (CATAPRES) 0.2 MG tablet Take 0.2 mg by mouth daily as needed (for high blood pressure levels).     Historical Provider, MD  diazepam (VALIUM) 5 MG tablet Take 1 tablet (5 mg total) by mouth every 8 (eight) hours as needed for muscle spasms. 11/24/13   Mancel BaleElliott Wentz, MD  HYDROcodone-acetaminophen (NORCO/VICODIN) 5-325 MG per tablet Take 1 tablet by mouth daily as needed for moderate pain (for shoulder and leg pain associated with arthritis).    Historical Provider, MD  levothyroxine (SYNTHROID, LEVOTHROID) 25 MCG tablet Take 25 mcg by mouth daily.     Historical Provider, MD  LORazepam  (ATIVAN) 1 MG tablet Take 1 mg by mouth at bedtime.      Historical Provider, MD  losartan-hydrochlorothiazide (HYZAAR) 100-12.5 MG per tablet Take 1 tablet by mouth daily.      Historical Provider, MD  predniSONE (DELTASONE) 20 MG tablet Take 1 tablet (20 mg total) by mouth 2 (two) times daily. 11/24/13   Mancel BaleElliott Wentz, MD   BP 180/87 mmHg  Pulse 81  Temp(Src) 98 F (36.7 C) (Oral)  Resp 16  SpO2 100% Physical Exam  Constitutional: She is oriented to person, place, and time. She appears well-developed.  HENT:  Head: Normocephalic.  Eyes: Conjunctivae and EOM are normal. No scleral icterus.  Neck: Neck supple. No thyromegaly present.  Cardiovascular: Normal rate and regular rhythm.  Exam reveals no gallop and no friction rub.   No murmur heard. Pulmonary/Chest: No stridor. She has no wheezes. She has no rales. She exhibits no tenderness.  Abdominal: She exhibits no distension. There is no tenderness. There is no rebound.  Musculoskeletal: Normal range of motion. She exhibits no edema.  Lymphadenopathy:    She has no cervical adenopathy.  Neurological: She is oriented to person, place, and time. She exhibits normal muscle tone. Coordination normal.  Skin: No rash noted. No erythema.  Psychiatric: She has a normal mood and affect. Her behavior is normal.  Nursing note and vitals reviewed.   ED Course  Procedures (including critical care time)  DIAGNOSTIC STUDIES: Oxygen Saturation is 100% on room air, normal by my interpretation.    COORDINATION OF CARE: 7:56 AM - Discussed treatment plan with pt at bedside which includes awaiting CXR results, and pt agreed to plan.   Labs Review Labs Reviewed  CBG MONITORING, ED   Imaging Review Dg Chest 2 View  10/21/2014   CLINICAL DATA:  Upper respiratory infection with cough for 2 weeks.  EXAM: CHEST  2 VIEW  COMPARISON:  10/07/2010  FINDINGS: Normal heart size and stable aortic, hilar contours. No acute infiltrate or edema. No effusion  or pneumothorax. No acute osseous findings.  IMPRESSION: No active cardiopulmonary disease.   Electronically Signed   By: Marnee SpringJonathon  Watts M.D.   On: 10/21/2014 07:54     EKG Interpretation None      MDM   Final diagnoses:  None    Bronchitis,   tx with zpak and follow up with pcp The chart was scribed for me under my direct supervision.  I personally performed the history, physical, and medical decision making and all procedures in the evaluation of this patient.Tammy Sims.   Gibril Mastro, MD 10/21/14 [phone]

## 2015-04-10 ENCOUNTER — Encounter (HOSPITAL_COMMUNITY): Payer: Self-pay | Admitting: Emergency Medicine

## 2015-04-10 ENCOUNTER — Emergency Department (HOSPITAL_COMMUNITY)
Admission: EM | Admit: 2015-04-10 | Discharge: 2015-04-10 | Disposition: A | Discharge status: Home / Self Care | Payer: Medicare Other | Attending: Emergency Medicine | Admitting: Emergency Medicine

## 2015-04-10 ENCOUNTER — Emergency Department (HOSPITAL_COMMUNITY): Payer: Medicare Other

## 2015-04-10 DIAGNOSIS — M79604 Pain in right leg: Secondary | ICD-10-CM | POA: Insufficient documentation

## 2015-04-10 DIAGNOSIS — N183 Chronic kidney disease, stage 3 (moderate): Secondary | ICD-10-CM | POA: Insufficient documentation

## 2015-04-10 DIAGNOSIS — Z8719 Personal history of other diseases of the digestive system: Secondary | ICD-10-CM | POA: Insufficient documentation

## 2015-04-10 DIAGNOSIS — Z79899 Other long term (current) drug therapy: Secondary | ICD-10-CM | POA: Diagnosis not present

## 2015-04-10 DIAGNOSIS — I129 Hypertensive chronic kidney disease with stage 1 through stage 4 chronic kidney disease, or unspecified chronic kidney disease: Secondary | ICD-10-CM | POA: Insufficient documentation

## 2015-04-10 DIAGNOSIS — E669 Obesity, unspecified: Secondary | ICD-10-CM | POA: Insufficient documentation

## 2015-04-10 DIAGNOSIS — Z862 Personal history of diseases of the blood and blood-forming organs and certain disorders involving the immune mechanism: Secondary | ICD-10-CM | POA: Insufficient documentation

## 2015-04-10 DIAGNOSIS — F329 Major depressive disorder, single episode, unspecified: Secondary | ICD-10-CM | POA: Insufficient documentation

## 2015-04-10 DIAGNOSIS — E039 Hypothyroidism, unspecified: Secondary | ICD-10-CM | POA: Insufficient documentation

## 2015-04-10 LAB — BASIC METABOLIC PANEL
Anion gap: 7 (ref 5–15)
BUN: 13 mg/dL (ref 6–20)
CHLORIDE: 104 mmol/L (ref 101–111)
CO2: 29 mmol/L (ref 22–32)
Calcium: 9.3 mg/dL (ref 8.9–10.3)
Creatinine, Ser: 1.14 mg/dL — ABNORMAL HIGH (ref 0.44–1.00)
GFR calc Af Amer: 50 mL/min — ABNORMAL LOW (ref >60)
GFR calc non Af Amer: 43 mL/min — ABNORMAL LOW (ref >60)
Glucose, Bld: 95 mg/dL (ref 65–99)
POTASSIUM: 3.9 mmol/L (ref 3.5–5.1)
SODIUM: 140 mmol/L (ref 135–145)

## 2015-04-10 MED ORDER — HYDROCODONE-ACETAMINOPHEN 5-325 MG PO TABS
1.0000 | ORAL_TABLET | ORAL | Status: DC | PRN
Start: 2015-04-10 — End: 2021-03-31

## 2015-04-10 MED ORDER — HYDROCODONE-ACETAMINOPHEN 5-325 MG PO TABS
2.0000 | ORAL_TABLET | Freq: Once | ORAL | Status: AC
  Administered 2015-04-10: 2 via ORAL
  Filled 2015-04-10: qty 2

## 2015-04-10 MED ORDER — TRAMADOL HCL 50 MG PO TABS
50.0000 mg | ORAL_TABLET | Freq: Once | ORAL | Status: AC
  Administered 2015-04-10: 50 mg via ORAL
  Filled 2015-04-10: qty 1

## 2015-04-10 NOTE — ED Provider Notes (Signed)
CSN: [id number]     Arrival date & time 04/10/15  1902 History  This chart was scribed for Eber Hong, MD by Elon Spanner, ED Scribe. This patient was seen in room APA19/APA19 and the patient's care was started at 8:03 PM.   Chief Complaint  Patient presents with  . Leg Pain   The history is provided by the patient and a relative. No language interpreter was used.    HPI Comments: Tammy Sims is a 81 y.o. female who presents to the Emergency Department complaining of constant, moderate, unchanged right lower leg pain onset yesterday without injury.  Associated symptoms include left leg swelling and difficulty ambulating due to pain (at baseline, only ambulates over small distances but without pain).  She denies hx of orthopaedic surgery on left lower extremity.  She denies fever, SOB, dysuria, diarrhea, cough.   Past Medical History  Diagnosis Date  . Hypertension     Lab: 08/2011 Normal BMet except creatinine of 1.3Stress nuclear in 10/2002; no definite abnormality, normal EF  . Obesity   . Depression   . Hypothyroidism   . Degenerative joint disease     Left THA in 2004  . Upper GI bleed 2004    NSAID- induced; gastritis, multiple gastric ulcers and duodenal ulcers  . Hematochezia 2002    hemorrhoids, rectal polyp  . Anemia     H&H of 11.3/33.6 in 08/2011 with normal MCV  . Chronic kidney disease, stage 3, mod decreased GFR     Creatinine of 1.3 in 08/2011  . Thrombocytopenia     Platelets of 116,000 in 08/2011  . Arrhythmia     PACs, sinus bradycardia, sinus arrhythmia sinus pauses  . Pancytopenia 07/20/2012   Past Surgical History  Procedure Laterality Date  . Total hip arthroplasty  05/2003    Left  . Cholecystectomy  10/2010    Laparoscopic  . Abdominal hysterectomy      plus unilateral oophorectomy, fibroids  . Cataract extraction, bilateral    . Colonoscopy  2002    Rectal polyp and hemorrhoids  . Colonoscopy   01/25/2003      Dr Smith:no evidence of polyps/no  evidence of diverticulosis  . Colonoscopy N/A 10/04/2013    Procedure: COLONOSCOPY;  Surgeon: Corbin Ade, MD;  Location: [facility];  Service: Endoscopy;  Laterality: N/A;  8:00   Family History  Problem Relation Age of Onset  . Hypertension Mother   . Colon cancer Neg Hx    Social History  Substance Use Topics  . Smoking status: Never Smoker   . Smokeless tobacco: Never Used  . Alcohol Use: No   OB History    No data available     Review of Systems A complete 10 system review of systems was obtained and all systems are negative except as noted in the HPI and PMH.   Allergies  Review of patient's allergies indicates no known allergies.  Home Medications   Prior to Admission medications   Medication Sig Start Date End Date Taking? Authorizing Provider  levothyroxine (SYNTHROID, LEVOTHROID) 25 MCG tablet Take 25 mcg by mouth daily.    Yes Historical Provider, MD  LORazepam (ATIVAN) 1 MG tablet Take 1 mg by mouth 2 (two) times daily as needed for anxiety or sleep.    Yes Historical Provider, MD  losartan-hydrochlorothiazide (HYZAAR) 100-12.5 MG per tablet Take 1 tablet by mouth daily.     Yes Historical Provider, MD  azithromycin (ZITHROMAX Z-PAK) 250 MG tablet  2 po day one, then 1 daily x 4 days Patient not taking: Reported on 04/10/2015 10/21/14   Bethann Berkshire, MD  cloNIDine (CATAPRES) 0.2 MG tablet Take 0.2 mg by mouth daily as needed (for high blood pressure levels).     Historical Provider, MD  diazepam (VALIUM) 5 MG tablet Take 1 tablet (5 mg total) by mouth every 8 (eight) hours as needed for muscle spasms. Patient not taking: Reported on 04/10/2015 11/24/13   Mancel Bale, MD  HYDROcodone-acetaminophen (NORCO/VICODIN) 5-325 MG per tablet Take 1 tablet by mouth every 4 (four) hours as needed. 04/10/15   Eber Hong, MD  predniSONE (DELTASONE) 20 MG tablet Take 1 tablet (20 mg total) by mouth 2 (two) times daily. Patient not taking: Reported on 04/10/2015 11/24/13   Mancel Bale, MD   BP 168/57 mmHg  Pulse 55  Temp(Src) 97.8 F (36.6 C) (Oral)  Resp 20  SpO2 97% Physical Exam  Constitutional: She is oriented to person, place, and time. Vital signs are normal. She appears well-developed and well-nourished.  Non-toxic appearance. No distress.  Afebrile, nontoxic, NAD  HENT:  Head: Normocephalic and atraumatic.  Mouth/Throat: Oropharynx is clear and moist and mucous membranes are normal.  Eyes: Conjunctivae and EOM are normal. Right eye exhibits no discharge. Left eye exhibits no discharge.  Neck: Normal range of motion. Neck supple.  Cardiovascular: Normal rate, regular rhythm, normal heart sounds and intact distal pulses.  Exam reveals no gallop and no friction rub.   No murmur heard. Normal cardiac exam.    Pulmonary/Chest: Effort normal and breath sounds normal. No respiratory distress. She has no decreased breath sounds. She has no wheezes. She has no rhonchi. She has no rales.  Abdominal: Soft. Normal appearance and bowel sounds are normal. She exhibits no distension. There is no tenderness. There is no rigidity, no rebound, no guarding, no CVA tenderness, no tenderness at McBurney's point and negative Murphy's sign.  Musculoskeletal: Normal range of motion.  Bilateral feet cap refill < 2 seconds.  Normal ROM at ankle and knee without any reproducible pain.  .  Tenderness anteriorly over anterior compartment and anterior tibia.   Neurological: She is alert and oriented to person, place, and time. She has normal strength. No sensory deficit.  Skin: Skin is warm, dry and intact. No rash noted.  Psychiatric: She has a normal mood and affect.  Nursing note and vitals reviewed.   ED Course  Procedures (including critical care time)  DIAGNOSTIC STUDIES: Oxygen Saturation is 100% on RA, normal by my interpretation.    COORDINATION OF CARE:  8:06 PM Will order labs and imaging of left lower leg.  Patient acknowledges and agrees with plan.    Labs  Review Labs Reviewed  BASIC METABOLIC PANEL - Abnormal; Notable for the following:    Creatinine, Ser 1.14 (*)    GFR calc non Af Amer 43 (*)    GFR calc Af Amer 50 (*)    All other components within normal limits    Imaging Review Dg Tibia/fibula Right  04/10/2015   CLINICAL DATA:  Pain for 1 day.  No history of trauma  EXAM: RIGHT TIBIA AND FIBULA - 2 VIEW  COMPARISON:  None.  FINDINGS: Frontal and lateral views were obtained. There is no fracture or dislocation. Joint spaces appear intact. There are small posterior and inferior calcaneal spurs. No abnormal periosteal reaction.  IMPRESSION: Calcaneal spurs. No fracture or dislocation. No appreciable arthropathy. No abnormal periosteal reaction.   Electronically  Signed   By: Bretta Bang III M.D.   On: 04/10/2015 20:50   US Venous Img Lower Unilateral Right  04/11/2015   CLINICAL DATA:  81 year old female with right lower extremity pain  EXAM: RIGHT LOWER EXTREMITY VENOUS DOPPLER ULTRASOUND  TECHNIQUE: Gray-scale sonography with graded compression, as well as color Doppler and duplex ultrasound were performed to evaluate the lower extremity deep venous systems from the level of the common femoral vein and including the common femoral, femoral, profunda femoral, popliteal and calf veins including the posterior tibial, peroneal and gastrocnemius veins when visible. The superficial great saphenous vein was also interrogated. Spectral Doppler was utilized to evaluate flow at rest and with distal augmentation maneuvers in the common femoral, femoral and popliteal veins.  COMPARISON:  None.  FINDINGS: Contralateral Common Femoral Vein: Respiratory phasicity is normal and symmetric with the symptomatic side. No evidence of thrombus. Normal compressibility.  Common Femoral Vein: No evidence of thrombus. Normal compressibility, respiratory phasicity and response to augmentation.  Saphenofemoral Junction: No evidence of thrombus. Normal compressibility and  flow on color Doppler imaging.  Profunda Femoral Vein: No evidence of thrombus. Normal compressibility and flow on color Doppler imaging.  Femoral Vein: No evidence of thrombus. Normal compressibility, respiratory phasicity and response to augmentation.  Popliteal Vein: No evidence of thrombus. Normal compressibility, respiratory phasicity and response to augmentation.  Calf Veins: No evidence of thrombus. Normal compressibility and flow on color Doppler imaging.  Superficial Great Saphenous Vein: No evidence of thrombus. Normal compressibility and flow on color Doppler imaging.  Venous Reflux:  None.  Other Findings:  None.  IMPRESSION: No evidence of deep venous thrombosis.   Electronically Signed   By: Malachy Moan M.D.   On: 04/11/2015 10:20   I have personally reviewed and evaluated these images and lab results as part of my medical decision-making.    MDM   Final diagnoses:  Leg pain, right    The pain seems consistent with a neuropathic pain. She does have severe hypertension which will need to be rechecked. She has a negative x-ray showed no signs of deep tissue infection or free air, there is no signs of abnormal potassium, she has ambulated but does need some assistance and she has pain when she steps down on the right side. She has no asymmetry, no obvious swelling, I will order a Doppler study for the morning, pain medication given, family will need to help her follow up very closely.  I personally performed the services described in this documentation, which was scribed in my presence. The recorded information has been reviewed and is accurate.      Eber Hong, MD 04/11/15 [phone]

## 2015-04-10 NOTE — Discharge Instructions (Signed)

## 2015-04-10 NOTE — ED Notes (Signed)
Patient complaining of right lower leg pain since yesterday evening. Also reports intermittent numbness to right lower leg. States history of varicose veins. Denies recent injury.

## 2015-04-11 ENCOUNTER — Ambulatory Visit (HOSPITAL_COMMUNITY)
Admit: 2015-04-11 | Discharge: 2015-04-11 | Disposition: A | Discharge status: Home / Self Care | Payer: Medicare Other | Source: Ambulatory Visit | Attending: Emergency Medicine | Admitting: Emergency Medicine

## 2015-04-11 DIAGNOSIS — M79604 Pain in right leg: Secondary | ICD-10-CM | POA: Insufficient documentation

## 2015-04-28 ENCOUNTER — Ambulatory Visit (HOSPITAL_COMMUNITY)
Admission: RE | Admit: 2015-04-28 | Discharge: 2015-04-28 | Disposition: A | Discharge status: Home / Self Care | Payer: Medicare Other | Source: Ambulatory Visit | Attending: Family Medicine | Admitting: Family Medicine

## 2015-04-28 ENCOUNTER — Other Ambulatory Visit (HOSPITAL_COMMUNITY): Payer: Self-pay | Admitting: Family Medicine

## 2015-04-28 DIAGNOSIS — M5431 Sciatica, right side: Secondary | ICD-10-CM

## 2015-04-28 DIAGNOSIS — M5136 Other intervertebral disc degeneration, lumbar region: Secondary | ICD-10-CM | POA: Diagnosis not present

## 2015-04-28 DIAGNOSIS — M21379 Foot drop, unspecified foot: Secondary | ICD-10-CM | POA: Insufficient documentation

## 2015-04-28 DIAGNOSIS — M4806 Spinal stenosis, lumbar region: Secondary | ICD-10-CM | POA: Diagnosis not present

## 2015-04-28 DIAGNOSIS — M545 Low back pain: Secondary | ICD-10-CM | POA: Diagnosis present

## 2015-04-28 DIAGNOSIS — R200 Anesthesia of skin: Secondary | ICD-10-CM | POA: Insufficient documentation

## 2015-04-28 DIAGNOSIS — M21371 Foot drop, right foot: Secondary | ICD-10-CM

## 2015-05-08 ENCOUNTER — Other Ambulatory Visit: Payer: Self-pay | Admitting: Neurosurgery

## 2015-05-08 DIAGNOSIS — M4316 Spondylolisthesis, lumbar region: Secondary | ICD-10-CM

## 2015-05-16 ENCOUNTER — Other Ambulatory Visit: Payer: Self-pay | Admitting: Neurosurgery

## 2015-05-16 ENCOUNTER — Ambulatory Visit
Admission: RE | Admit: 2015-05-16 | Discharge: 2015-05-16 | Disposition: A | Discharge status: Home / Self Care | Payer: Medicare Other | Source: Ambulatory Visit | Attending: Neurosurgery | Admitting: Neurosurgery

## 2015-05-16 DIAGNOSIS — M4316 Spondylolisthesis, lumbar region: Secondary | ICD-10-CM

## 2015-05-16 MED ORDER — IOHEXOL 180 MG/ML  SOLN
1.0000 mL | Freq: Once | INTRAMUSCULAR | Status: DC | PRN
  Administered 2015-05-16: 1 mL via EPIDURAL

## 2015-05-16 MED ORDER — METHYLPREDNISOLONE ACETATE 40 MG/ML INJ SUSP (RADIOLOG
120.0000 mg | Freq: Once | INTRAMUSCULAR | Status: AC
  Administered 2015-05-16: 120 mg via EPIDURAL

## 2015-05-16 NOTE — Discharge Instructions (Signed)

## 2015-06-13 ENCOUNTER — Other Ambulatory Visit: Payer: Self-pay | Admitting: Neurosurgery

## 2015-06-13 DIAGNOSIS — M4316 Spondylolisthesis, lumbar region: Secondary | ICD-10-CM

## 2015-06-25 ENCOUNTER — Ambulatory Visit
Admission: RE | Admit: 2015-06-25 | Discharge: 2015-06-25 | Disposition: A | Discharge status: Home / Self Care | Payer: Medicare Other | Source: Ambulatory Visit | Attending: Neurosurgery | Admitting: Neurosurgery

## 2015-06-25 DIAGNOSIS — M4316 Spondylolisthesis, lumbar region: Secondary | ICD-10-CM

## 2015-06-25 MED ORDER — IOHEXOL 180 MG/ML  SOLN
17.0000 mL | Freq: Once | INTRAMUSCULAR | Status: DC | PRN
  Administered 2015-06-25: 17 mL via INTRATHECAL

## 2015-06-25 MED ORDER — DIAZEPAM 5 MG PO TABS
5.0000 mg | ORAL_TABLET | Freq: Once | ORAL | Status: AC
  Administered 2015-06-25: 5 mg via ORAL

## 2015-06-25 NOTE — Discharge Instructions (Signed)

## 2015-12-14 ENCOUNTER — Encounter (HOSPITAL_COMMUNITY): Payer: Self-pay

## 2015-12-14 ENCOUNTER — Emergency Department (HOSPITAL_COMMUNITY)
Admission: EM | Admit: 2015-12-14 | Discharge: 2015-12-14 | Disposition: A | Discharge status: Home / Self Care | Payer: Medicare Other | Attending: Emergency Medicine | Admitting: Emergency Medicine

## 2015-12-14 DIAGNOSIS — E039 Hypothyroidism, unspecified: Secondary | ICD-10-CM | POA: Insufficient documentation

## 2015-12-14 DIAGNOSIS — G8929 Other chronic pain: Secondary | ICD-10-CM | POA: Diagnosis not present

## 2015-12-14 DIAGNOSIS — M25559 Pain in unspecified hip: Secondary | ICD-10-CM | POA: Diagnosis not present

## 2015-12-14 DIAGNOSIS — I129 Hypertensive chronic kidney disease with stage 1 through stage 4 chronic kidney disease, or unspecified chronic kidney disease: Secondary | ICD-10-CM | POA: Insufficient documentation

## 2015-12-14 DIAGNOSIS — Z6829 Body mass index (BMI) 29.0-29.9, adult: Secondary | ICD-10-CM | POA: Insufficient documentation

## 2015-12-14 DIAGNOSIS — Z79899 Other long term (current) drug therapy: Secondary | ICD-10-CM | POA: Insufficient documentation

## 2015-12-14 DIAGNOSIS — M25569 Pain in unspecified knee: Secondary | ICD-10-CM | POA: Diagnosis not present

## 2015-12-14 DIAGNOSIS — E669 Obesity, unspecified: Secondary | ICD-10-CM | POA: Diagnosis not present

## 2015-12-14 DIAGNOSIS — N183 Chronic kidney disease, stage 3 (moderate): Secondary | ICD-10-CM | POA: Insufficient documentation

## 2015-12-14 DIAGNOSIS — F329 Major depressive disorder, single episode, unspecified: Secondary | ICD-10-CM | POA: Diagnosis not present

## 2015-12-14 LAB — COMPREHENSIVE METABOLIC PANEL
ALBUMIN: 3.9 g/dL (ref 3.5–5.0)
ALT: 16 U/L (ref 14–54)
AST: 27 U/L (ref 15–41)
Alkaline Phosphatase: 88 U/L (ref 38–126)
Anion gap: 3 — ABNORMAL LOW (ref 5–15)
BILIRUBIN TOTAL: 0.6 mg/dL (ref 0.3–1.2)
BUN: 23 mg/dL — AB (ref 6–20)
CO2: 29 mmol/L (ref 22–32)
Calcium: 9.4 mg/dL (ref 8.9–10.3)
Chloride: 104 mmol/L (ref 101–111)
Creatinine, Ser: 1.08 mg/dL — ABNORMAL HIGH (ref 0.44–1.00)
GFR calc Af Amer: 53 mL/min — ABNORMAL LOW (ref >60)
GFR calc non Af Amer: 46 mL/min — ABNORMAL LOW (ref >60)
GLUCOSE: 99 mg/dL (ref 65–99)
POTASSIUM: 3.9 mmol/L (ref 3.5–5.1)
Sodium: 136 mmol/L (ref 135–145)
TOTAL PROTEIN: 6.9 g/dL (ref 6.5–8.1)

## 2015-12-14 LAB — TSH: TSH: 2.712 u[IU]/mL (ref 0.350–4.500)

## 2015-12-14 LAB — SEDIMENTATION RATE: Sed Rate: 15 mm/hr (ref 0–22)

## 2015-12-14 LAB — CBC WITH DIFFERENTIAL/PLATELET
BASOS ABS: 0 10*3/uL (ref 0.0–0.1)
Basophils Relative: 0 %
EOS PCT: 1 %
Eosinophils Absolute: 0 10*3/uL (ref 0.0–0.7)
HCT: 33.6 % — ABNORMAL LOW (ref 36.0–46.0)
Hemoglobin: 11.1 g/dL — ABNORMAL LOW (ref 12.0–15.0)
LYMPHS ABS: 1.3 10*3/uL (ref 0.7–4.0)
LYMPHS PCT: 38 %
MCH: 31.2 pg (ref 26.0–34.0)
MCHC: 33 g/dL (ref 30.0–36.0)
MCV: 94.4 fL (ref 78.0–100.0)
MONO ABS: 0.2 10*3/uL (ref 0.1–1.0)
MONOS PCT: 6 %
Neutro Abs: 1.9 10*3/uL (ref 1.7–7.7)
Neutrophils Relative %: 55 %
PLATELETS: 125 10*3/uL — AB (ref 150–400)
RBC: 3.56 MIL/uL — ABNORMAL LOW (ref 3.87–5.11)
RDW: 12.2 % (ref 11.5–15.5)
WBC: 3.4 10*3/uL — ABNORMAL LOW (ref 4.0–10.5)

## 2015-12-14 LAB — URINALYSIS, ROUTINE W REFLEX MICROSCOPIC
Bilirubin Urine: NEGATIVE
Glucose, UA: NEGATIVE mg/dL
Hgb urine dipstick: NEGATIVE
KETONES UR: NEGATIVE mg/dL
LEUKOCYTES UA: NEGATIVE
NITRITE: NEGATIVE
PH: 6 (ref 5.0–8.0)
Protein, ur: NEGATIVE mg/dL

## 2015-12-14 LAB — URIC ACID: Uric Acid, Serum: 4.3 mg/dL (ref 2.3–6.6)

## 2015-12-14 MED ORDER — ONDANSETRON 4 MG PO TBDP
4.0000 mg | ORAL_TABLET | Freq: Once | ORAL | Status: AC
  Administered 2015-12-14: 4 mg via ORAL
  Filled 2015-12-14: qty 1

## 2015-12-14 MED ORDER — PREDNISONE 10 MG (21) PO TBPK: 10.0000 mg | ORAL_TABLET | Freq: Every day | ORAL | Status: DC

## 2015-12-14 MED ORDER — OXYCODONE-ACETAMINOPHEN 5-325 MG PO TABS: 1.0000 | ORAL_TABLET | ORAL | Status: DC | PRN

## 2015-12-14 MED ORDER — DEXAMETHASONE SODIUM PHOSPHATE 4 MG/ML IJ SOLN
10.0000 mg | Freq: Once | INTRAMUSCULAR | Status: AC
  Administered 2015-12-14: 10 mg via INTRAMUSCULAR
  Filled 2015-12-14: qty 3

## 2015-12-14 MED ORDER — MORPHINE SULFATE (PF) 4 MG/ML IV SOLN
4.0000 mg | Freq: Once | INTRAVENOUS | Status: AC
  Administered 2015-12-14: 4 mg via INTRAMUSCULAR
  Filled 2015-12-14: qty 1

## 2015-12-14 NOTE — ED Provider Notes (Signed)
CSN: [id number]     Arrival date & time 12/14/15  1353 History   First MD Initiated Contact with Patient 12/14/15 1356     Chief Complaint  Patient presents with  . Joint Pain   PT HAS A HX OF ARTHRITIS AND DJD.  SHE HAS PAIN IN ALL HER JOINTS.  THIS HAS BEEN GOING ON FOR AT LEAST 2 MONTHS.  THE PT SAID THAT SHE HAS NOTHING FOR PAIN AT HOME.  PT'S DAUGHTER SAID PT HAD BEEN ON HYDROCODONE IN THE PAST, BUT THAT DID NOT HELP.  PREDNISONE HAS HELPED IN THE PAST.  (Consider location/radiation/quality/duration/timing/severity/associated sxs/prior Treatment) The history is provided by the patient and a relative.    Past Medical History  Diagnosis Date  . Hypertension     Lab: 08/2011 Normal BMet except creatinine of 1.3Stress nuclear in 10/2002; no definite abnormality, normal EF  . Obesity   . Depression   . Hypothyroidism   . Degenerative joint disease     Left THA in 2004  . Upper GI bleed 2004    NSAID- induced; gastritis, multiple gastric ulcers and duodenal ulcers  . Hematochezia 2002    hemorrhoids, rectal polyp  . Anemia     H&H of 11.3/33.6 in 08/2011 with normal MCV  . Chronic kidney disease, stage 3, mod decreased GFR     Creatinine of 1.3 in 08/2011  . Thrombocytopenia (HCC)     Platelets of 116,000 in 08/2011  . Arrhythmia     PACs, sinus bradycardia, sinus arrhythmia sinus pauses  . Pancytopenia (HCC) 07/20/2012   Past Surgical History  Procedure Laterality Date  . Total hip arthroplasty  05/2003    Left  . Cholecystectomy  10/2010    Laparoscopic  . Abdominal hysterectomy      plus unilateral oophorectomy, fibroids  . Cataract extraction, bilateral    . Colonoscopy  2002    Rectal polyp and hemorrhoids  . Colonoscopy   01/25/2003      Dr Smith:no evidence of polyps/no evidence of diverticulosis  . Colonoscopy N/A 10/04/2013    Procedure: COLONOSCOPY;  Surgeon: Corbin Ade, MD;  Location: [facility];  Service: Endoscopy;  Laterality: N/A;  8:00   Family  History  Problem Relation Age of Onset  . Hypertension Mother   . Colon cancer Neg Hx    Social History  Substance Use Topics  . Smoking status: Never Smoker   . Smokeless tobacco: Never Used  . Alcohol Use: No   OB History    No data available     Review of Systems  Musculoskeletal: Positive for arthralgias.  All other systems reviewed and are negative.     Allergies  Review of patient's allergies indicates no known allergies.  Home Medications   Prior to Admission medications   Medication Sig Start Date End Date Taking? Authorizing Provider  cloNIDine (CATAPRES) 0.2 MG tablet Take 0.2 mg by mouth daily as needed (for high blood pressure levels).    Yes Historical Provider, MD  levothyroxine (SYNTHROID, LEVOTHROID) 25 MCG tablet Take 25 mcg by mouth daily.    Yes Historical Provider, MD  LORazepam (ATIVAN) 1 MG tablet Take 1 mg by mouth 2 (two) times daily as needed for anxiety or sleep.    Yes Historical Provider, MD  losartan-hydrochlorothiazide (HYZAAR) 100-12.5 MG per tablet Take 1 tablet by mouth daily.     Yes Historical Provider, MD  azithromycin (ZITHROMAX Z-PAK) 250 MG tablet 2 po day one, then 1 daily  x 4 days Patient not taking: Reported on 04/10/2015 10/21/14   Bethann Berkshire, MD  diazepam (VALIUM) 5 MG tablet Take 1 tablet (5 mg total) by mouth every 8 (eight) hours as needed for muscle spasms. Patient not taking: Reported on 04/10/2015 11/24/13   Mancel Bale, MD  HYDROcodone-acetaminophen (NORCO/VICODIN) 5-325 MG per tablet Take 1 tablet by mouth every 4 (four) hours as needed. Patient not taking: Reported on 12/14/2015 04/10/15   Eber Hong, MD  oxyCODONE-acetaminophen (PERCOCET/ROXICET) 5-325 MG tablet Take 1 tablet by mouth every 4 (four) hours as needed for severe pain. 12/14/15   Jacalyn Lefevre, MD  predniSONE (STERAPRED UNI-PAK 21 TAB) 10 MG (21) TBPK tablet Take 1 tablet (10 mg total) by mouth daily. Take 6 tabs by mouth daily  for 2 days, then 5 tabs for 2  days, then 4 tabs for 2 days, then 3 tabs for 2 days, 2 tabs for 2 days, then 1 tab by mouth daily for 2 days 12/14/15   Jacalyn Lefevre, MD   BP 197/166 mmHg  Pulse 81  Temp(Src) 98.2 F (36.8 C) (Oral)  Resp 20  Ht  (1.651 m)  Wt 175 lb (79.379 kg)  BMI 29.12 kg/m2  SpO2 97% Physical Exam  Constitutional: She is oriented to person, place, and time. She appears well-developed and well-nourished.  HENT:  Head: Normocephalic and atraumatic.  Right Ear: External ear normal.  Left Ear: External ear normal.  Nose: Nose normal.  Mouth/Throat: Oropharynx is clear and moist.  Eyes: Conjunctivae and EOM are normal. Pupils are equal, round, and reactive to light.  Neck: Normal range of motion. Neck supple.  Cardiovascular: Normal rate, regular rhythm, normal heart sounds and intact distal pulses.   Pulmonary/Chest: Effort normal and breath sounds normal.  Abdominal: Soft. Bowel sounds are normal.  Musculoskeletal: She exhibits tenderness. She exhibits no edema.  DIFFUSE JOINT PAIN.  ARTHRITIC CHANGES NOTED IN HANDS.  NO OTHER DEFORMITIES NOTED.  Neurological: She is alert and oriented to person, place, and time.  Skin: Skin is warm and dry.  Psychiatric: She has a normal mood and affect. Her behavior is normal. Judgment and thought content normal.  Nursing note and vitals reviewed.   ED Course  Procedures (including critical care time) Labs Review Labs Reviewed  URINALYSIS, ROUTINE W REFLEX MICROSCOPIC (NOT AT [facility]) - Abnormal; Notable for the following:    APPearance CLOUDY (*)    Specific Gravity, Urine <1.005 (*)    All other components within normal limits  COMPREHENSIVE METABOLIC PANEL - Abnormal; Notable for the following:    BUN 23 (*)    Creatinine, Ser 1.08 (*)    GFR calc non Af Amer 46 (*)    GFR calc Af Amer 53 (*)    Anion gap 3 (*)    All other components within normal limits  CBC WITH DIFFERENTIAL/PLATELET - Abnormal; Notable for the following:    WBC 3.4 (*)     RBC 3.56 (*)    Hemoglobin 11.1 (*)    HCT 33.6 (*)    Platelets 125 (*)    All other components within normal limits  URIC ACID  TSH  SEDIMENTATION RATE    Imaging Review No results found. I have personally reviewed and evaluated these images and lab results as part of my medical decision-making.   EKG Interpretation None      MDM  PAIN HAS IMPROVED.  PT KNOWS TO RETURN IF WORSE AND TO F/U WITH PCP. Final diagnoses:  Chronic arthralgias of knees and hips, unspecified laterality        Jacalyn LefevreJulie Lujuana Kapler, MD 12/14/15 [phone]

## 2015-12-14 NOTE — ED Notes (Signed)
Daughter reports pt c/o pain in multiple joints on r side for the past 2 months.  Reports pain started after receiving contrast for a ct scan.  Reports pain worse today

## 2015-12-14 NOTE — ED Notes (Signed)
Pt made aware to return if symptoms worsen or if any life threatening symptoms occur.   

## 2016-08-27 ENCOUNTER — Ambulatory Visit (HOSPITAL_COMMUNITY)
Admission: RE | Admit: 2016-08-27 | Discharge: 2016-08-27 | Disposition: A | Discharge status: Home / Self Care | Payer: Medicare Other | Source: Ambulatory Visit | Attending: Family Medicine | Admitting: Family Medicine

## 2016-08-27 ENCOUNTER — Other Ambulatory Visit (HOSPITAL_COMMUNITY): Payer: Self-pay | Admitting: Family Medicine

## 2016-08-27 DIAGNOSIS — R634 Abnormal weight loss: Secondary | ICD-10-CM

## 2016-12-20 ENCOUNTER — Other Ambulatory Visit (HOSPITAL_COMMUNITY): Payer: Self-pay | Admitting: Family Medicine

## 2016-12-20 DIAGNOSIS — Z780 Asymptomatic menopausal state: Secondary | ICD-10-CM

## 2016-12-29 ENCOUNTER — Encounter (HOSPITAL_COMMUNITY): Payer: Self-pay

## 2016-12-29 ENCOUNTER — Other Ambulatory Visit (HOSPITAL_COMMUNITY): Payer: Medicare Other

## 2017-01-11 ENCOUNTER — Ambulatory Visit (HOSPITAL_COMMUNITY)
Admission: RE | Admit: 2017-01-11 | Discharge: 2017-01-11 | Disposition: A | Discharge status: Home / Self Care | Payer: Medicare Other | Source: Ambulatory Visit | Attending: Family Medicine | Admitting: Family Medicine

## 2017-01-11 DIAGNOSIS — Z780 Asymptomatic menopausal state: Secondary | ICD-10-CM | POA: Insufficient documentation

## 2021-02-12 ENCOUNTER — Other Ambulatory Visit (HOSPITAL_COMMUNITY): Payer: Self-pay | Admitting: Family Medicine

## 2021-02-12 DIAGNOSIS — I499 Cardiac arrhythmia, unspecified: Secondary | ICD-10-CM

## 2021-02-23 ENCOUNTER — Other Ambulatory Visit: Payer: Self-pay

## 2021-02-23 ENCOUNTER — Ambulatory Visit (HOSPITAL_COMMUNITY)
Admission: RE | Admit: 2021-02-23 | Discharge: 2021-02-23 | Disposition: A | Discharge status: Home / Self Care | Payer: Medicare Other | Source: Ambulatory Visit | Attending: Family Medicine | Admitting: Family Medicine

## 2021-02-23 DIAGNOSIS — I499 Cardiac arrhythmia, unspecified: Secondary | ICD-10-CM | POA: Diagnosis not present

## 2021-02-23 DIAGNOSIS — I129 Hypertensive chronic kidney disease with stage 1 through stage 4 chronic kidney disease, or unspecified chronic kidney disease: Secondary | ICD-10-CM | POA: Diagnosis not present

## 2021-02-23 DIAGNOSIS — I071 Rheumatic tricuspid insufficiency: Secondary | ICD-10-CM | POA: Diagnosis not present

## 2021-02-23 DIAGNOSIS — I361 Nonrheumatic tricuspid (valve) insufficiency: Secondary | ICD-10-CM

## 2021-02-23 DIAGNOSIS — N183 Chronic kidney disease, stage 3 unspecified: Secondary | ICD-10-CM | POA: Insufficient documentation

## 2021-02-23 LAB — ECHOCARDIOGRAM COMPLETE
Area-P 1/2: 3.43 cm2
MV M vel: 6.35 m/s
MV Peak grad: 161.3 mmHg
Radius: 0.3 cm
S' Lateral: 2.4 cm

## 2021-02-23 NOTE — Progress Notes (Signed)
*  PRELIMINARY RESULTS* Echocardiogram 2D Echocardiogram has been performed.  Stacey Drain 02/23/2021, 4:06 PM

## 2021-03-31 ENCOUNTER — Encounter (HOSPITAL_COMMUNITY): Payer: Self-pay | Admitting: Emergency Medicine

## 2021-03-31 ENCOUNTER — Other Ambulatory Visit: Payer: Self-pay

## 2021-03-31 ENCOUNTER — Emergency Department (HOSPITAL_COMMUNITY): Payer: Medicare Other

## 2021-03-31 ENCOUNTER — Emergency Department (HOSPITAL_COMMUNITY)
Admission: EM | Admit: 2021-03-31 | Discharge: 2021-03-31 | Disposition: A | Discharge status: Home / Self Care | Payer: Medicare Other | Attending: Emergency Medicine | Admitting: Emergency Medicine

## 2021-03-31 DIAGNOSIS — M79604 Pain in right leg: Secondary | ICD-10-CM

## 2021-03-31 DIAGNOSIS — I129 Hypertensive chronic kidney disease with stage 1 through stage 4 chronic kidney disease, or unspecified chronic kidney disease: Secondary | ICD-10-CM | POA: Diagnosis not present

## 2021-03-31 DIAGNOSIS — M79605 Pain in left leg: Secondary | ICD-10-CM | POA: Diagnosis not present

## 2021-03-31 DIAGNOSIS — R531 Weakness: Secondary | ICD-10-CM | POA: Diagnosis not present

## 2021-03-31 DIAGNOSIS — Z96642 Presence of left artificial hip joint: Secondary | ICD-10-CM | POA: Insufficient documentation

## 2021-03-31 DIAGNOSIS — Z79899 Other long term (current) drug therapy: Secondary | ICD-10-CM | POA: Insufficient documentation

## 2021-03-31 DIAGNOSIS — N183 Chronic kidney disease, stage 3 unspecified: Secondary | ICD-10-CM | POA: Insufficient documentation

## 2021-03-31 DIAGNOSIS — E039 Hypothyroidism, unspecified: Secondary | ICD-10-CM | POA: Insufficient documentation

## 2021-03-31 HISTORY — DX: Anxiety disorder, unspecified: F41.9

## 2021-03-31 LAB — CBC WITH DIFFERENTIAL/PLATELET
Abs Immature Granulocytes: 0.01 10*3/uL (ref 0.00–0.07)
Basophils Absolute: 0 10*3/uL (ref 0.0–0.1)
Basophils Relative: 1 %
Eosinophils Absolute: 0 10*3/uL (ref 0.0–0.5)
Eosinophils Relative: 1 %
HCT: 32.5 % — ABNORMAL LOW (ref 36.0–46.0)
Hemoglobin: 10 g/dL — ABNORMAL LOW (ref 12.0–15.0)
Immature Granulocytes: 0 %
Lymphocytes Relative: 39 %
Lymphs Abs: 1.3 10*3/uL (ref 0.7–4.0)
MCH: 31.2 pg (ref 26.0–34.0)
MCHC: 30.8 g/dL (ref 30.0–36.0)
MCV: 101.2 fL — ABNORMAL HIGH (ref 80.0–100.0)
Monocytes Absolute: 0.3 10*3/uL (ref 0.1–1.0)
Monocytes Relative: 10 %
Neutro Abs: 1.7 10*3/uL (ref 1.7–7.7)
Neutrophils Relative %: 49 %
Platelets: 152 10*3/uL (ref 150–400)
RBC: 3.21 MIL/uL — ABNORMAL LOW (ref 3.87–5.11)
RDW: 12.7 % (ref 11.5–15.5)
WBC: 3.4 10*3/uL — ABNORMAL LOW (ref 4.0–10.5)
nRBC: 0 % (ref 0.0–0.2)

## 2021-03-31 LAB — BASIC METABOLIC PANEL
Anion gap: 10 (ref 5–15)
BUN: 32 mg/dL — ABNORMAL HIGH (ref 8–23)
CO2: 27 mmol/L (ref 22–32)
Calcium: 9.2 mg/dL (ref 8.9–10.3)
Chloride: 103 mmol/L (ref 98–111)
Creatinine, Ser: 1.18 mg/dL — ABNORMAL HIGH (ref 0.44–1.00)
GFR, Estimated: 44 mL/min — ABNORMAL LOW (ref >60)
Glucose, Bld: 92 mg/dL (ref 70–99)
Potassium: 3.8 mmol/L (ref 3.5–5.1)
Sodium: 140 mmol/L (ref 135–145)

## 2021-03-31 LAB — TSH: TSH: 4.781 u[IU]/mL — ABNORMAL HIGH (ref 0.350–4.500)

## 2021-03-31 LAB — T4, FREE: Free T4: 1.25 ng/dL — ABNORMAL HIGH (ref 0.61–1.12)

## 2021-03-31 MED ORDER — GABAPENTIN 100 MG PO CAPS: 100.0000 mg | ORAL_CAPSULE | Freq: Once | ORAL | Status: DC

## 2021-03-31 MED ORDER — PREGABALIN 25 MG PO CAPS
25.0000 mg | ORAL_CAPSULE | Freq: Once | ORAL | Status: AC
  Administered 2021-03-31: 25 mg via ORAL
  Filled 2021-03-31: qty 1

## 2021-03-31 MED ORDER — PREGABALIN 25 MG PO CAPS: 25.0000 mg | ORAL_CAPSULE | Freq: Two times a day (BID) | ORAL | 0 refills | Status: AC

## 2021-03-31 NOTE — ED Triage Notes (Signed)
Daughter reports pt has been unable to walk x 2 weeks and been unable to get out of bed or balance herself x 2 days; dtr reports neuropathy

## 2021-03-31 NOTE — ED Provider Notes (Signed)
[facility] EMERGENCY DEPARTMENT Provider Note   CSN: [id number] Arrival date & time: 03/31/21  1035     History Chief Complaint  Patient presents with   Leg Pain    Tammy Sims is a 87 y.o. female.  HPI Patient presents with her daughter who assists with much of the history. Patient has multiple medical issues, including neuropathy lower extremities.  It seems as though over the past 2 weeks in particular, though for longer time in general the patient has had difficulty with ambulation, performing ADL.  She has reportedly previously been told that she had therapeutic options, but was recently informed that she has aged out of being a candidate for these.  She has had falls recently, though not seemingly within the past week or 2.  She currently complains of pain in her left shoulder, both hips, both knees.  No other pain, pain is sore, not improved with OTC medication. There is no report of fever, cough, nausea, vomiting.    Past Medical History:  Diagnosis Date   Anemia    H&H of 11.3/33.6 in 08/2011 with normal MCV   Anxiety    Arrhythmia    PACs, sinus bradycardia, sinus arrhythmia sinus pauses   Chronic kidney disease, stage 3, mod decreased GFR (HCC)    Creatinine of 1.3 in 08/2011   Degenerative joint disease    Left THA in 2004   Depression    Hematochezia 08/02/2000   hemorrhoids, rectal polyp   Hypertension    Lab: 08/2011 Normal BMet except creatinine of 1.3Stress nuclear in 10/2002; no definite abnormality, normal EF   Hypothyroidism    Obesity    Pancytopenia (HCC) 07/20/2012   Thrombocytopenia (HCC)    Platelets of 116,000 in 08/2011   Upper GI bleed 08/02/2002   NSAID- induced; gastritis, multiple gastric ulcers and duodenal ulcers    Patient Active Problem List   Diagnosis Date Noted   Heme positive stool 09/20/2013   Pancytopenia (HCC) 07/20/2012   Hypertension    Obesity    Chronic kidney disease, stage 3, mod decreased GFR (HCC)     Thrombocytopenia (HCC)    Anemia    Arrhythmia    HYPOTHYROIDISM 03/01/2007    Past Surgical History:  Procedure Laterality Date   ABDOMINAL HYSTERECTOMY     plus unilateral oophorectomy, fibroids   CATARACT EXTRACTION, BILATERAL     CHOLECYSTECTOMY  10/2010   Laparoscopic   COLONOSCOPY  2002   Rectal polyp and hemorrhoids   COLONOSCOPY   01/25/2003     Dr Smith:no evidence of polyps/no evidence of diverticulosis   COLONOSCOPY N/A 10/04/2013   Procedure: COLONOSCOPY;  Surgeon: Corbin Ade, MD;  Location: [facility];  Service: Endoscopy;  Laterality: N/A;  8:00   TOTAL HIP ARTHROPLASTY  05/2003   Left     OB History   No obstetric history on file.     Family History  Problem Relation Age of Onset   Hypertension Mother    Colon cancer Neg Hx     Social History   Tobacco Use   Smoking status: Never   Smokeless tobacco: Never  Substance Use Topics   Alcohol use: No   Drug use: No    Home Medications Prior to Admission medications   Medication Sig Start Date End Date Taking? Authorizing Provider  cloNIDine (CATAPRES) 0.2 MG tablet Take 0.2 mg by mouth daily as needed (for high blood pressure levels).    Yes [provider]  levothyroxine (SYNTHROID, LEVOTHROID) 25 MCG tablet Take 25 mcg by mouth daily.    Yes [provider]  LORazepam (ATIVAN) 1 MG tablet Take 1 mg by mouth 2 (two) times daily as needed for anxiety or sleep.    Yes [provider]  losartan-hydrochlorothiazide (HYZAAR) 100-12.5 MG per tablet Take 1 tablet by mouth daily.     Yes [provider]  pregabalin (LYRICA) 25 MG capsule Take 1 capsule (25 mg total) by mouth 2 (two) times daily. 03/31/21  Yes Gerhard MunchLockwood, Karyl Sharrar, MD    Allergies    Patient has no known allergies.  Review of Systems   Review of Systems  Constitutional:        Per HPI, otherwise negative  HENT:         Per HPI, otherwise negative  Respiratory:         Per HPI, otherwise negative   Cardiovascular:        Per HPI, otherwise negative  Gastrointestinal:  Negative for vomiting.  Endocrine:       Negative aside from HPI  Genitourinary:        Neg aside from HPI   Musculoskeletal:        Per HPI, otherwise negative  Skin: Negative.   Neurological:  Positive for weakness. Negative for syncope.   Physical Exam Updated Vital Signs BP (!) 163/63 (BP Location: Right Arm)   Pulse 82   Temp 98.3 F (36.8 C) (Oral)   Resp 18   Ht 5\' 5"  (1.651 m)   Wt (!) 0.003 kg   SpO2 96%   Physical Exam Vitals and nursing note reviewed.  Constitutional:      General: She is not in acute distress.    Appearance: She is well-developed.     Comments: Frail-appearing elderly female awake and alert  HENT:     Head: Normocephalic and atraumatic.  Eyes:     Conjunctiva/sclera: Conjunctivae normal.  Cardiovascular:     Rate and Rhythm: Normal rate and regular rhythm.  Pulmonary:     Effort: Pulmonary effort is normal. No respiratory distress.     Breath sounds: Normal breath sounds. No stridor.  Abdominal:     General: There is no distension.  Musculoskeletal:     Comments: Diffuse atrophy, no deformities.  She does have mild tenderness palpation left superior posterior shoulder, with slight decreased range of motion with shoulder flexion limited secondary pain in this area. She describes pain with palpation of both hips, but can flex each to command.  She flexes each knee to command, similarly, ankles unremarkable aside from mild swelling  Skin:    General: Skin is warm and dry.  Neurological:     Mental Status: She is alert and oriented to person, place, and time.     Cranial Nerves: No cranial nerve deficit.    ED Results / Procedures / Treatments   Labs (all labs ordered are listed, but only abnormal results are displayed) Labs Reviewed  BASIC METABOLIC PANEL - Abnormal; Notable for the following components:      Result Value   BUN 32 (*)    Creatinine, Ser 1.18 (*)     GFR, Estimated 44 (*)    All other components within normal limits  CBC WITH DIFFERENTIAL/PLATELET - Abnormal; Notable for the following components:   WBC 3.4 (*)    RBC 3.21 (*)    Hemoglobin 10.0 (*)    HCT 32.5 (*)    MCV 101.2 (*)  All other components within normal limits  TSH - Abnormal; Notable for the following components:   TSH 4.781 (*)    All other components within normal limits  T4, FREE    EKG None  Radiology DG Pelvis 1-2 Views  Result Date: 03/31/2021 CLINICAL DATA:  Unable to walk for 2 weeks, frequent falls last 1 month ago. History of LEFT hip arthroplasty. EXAM: PELVIS - 1-2 VIEW COMPARISON:  CT abdomen and pelvis from 2013, hip radiographs from 2015. FINDINGS: Findings LEFT hip arthroplasty as on the previous study. No sign of acute fracture of the bony pelvis. Marked degenerative changes about the RIGHT hip. Marked degenerative changes in the lumbar spine as on the previous exam. Oblique lucency passing along the L4 level extending obliquely across the L5 level on the frontal view (from LEFT to RIGHT passing superior to inferior). This has a different pattern than on the previous exam, significance uncertain. IMPRESSION: Oblique lucency passing along the L4 level extending obliquely across the L5 level, significance is uncertain though this raises the question of fracture in the lumbar spine. Consider dedicated lumbar evaluation for further assessment. No sign of acute fracture in the bony pelvis. Post LEFT hip arthroplasty, incompletely imaged on AP view with signs of degenerative change in the RIGHT hip. Electronically Signed   By: Donzetta Kohut M.D.   On: 03/31/2021 13:31   DG Knee 2 Views Left  Result Date: 03/31/2021 CLINICAL DATA:  Left knee pain and difficulty ambulating, history of fall 1 month ago, initial encounter EXAM: LEFT KNEE - 2 VIEW COMPARISON:  None. FINDINGS: No acute fracture or dislocation is noted. No joint effusion is seen. Mild soft tissue  edema noted. IMPRESSION: No acute bony abnormality seen. Electronically Signed   By: Alcide Clever M.D.   On: 03/31/2021 13:30   DG Knee 2 Views Right  Result Date: 03/31/2021 CLINICAL DATA:  Fall EXAM: RIGHT KNEE - 1-2 VIEW COMPARISON:  None. FINDINGS: No acute fracture or dislocation. There is generalized soft tissue swelling. There is mild medial and patellofemoral compartment degenerative change. Vascular calcifications. IMPRESSION: Generalized soft tissue swelling.  No acute osseous abnormality. Mild medial patellofemoral compartment degenerative arthritis. Electronically Signed   By: Caprice Renshaw M.D.   On: 03/31/2021 13:28   DG Shoulder Left  Result Date: 03/31/2021 CLINICAL DATA:  Fall EXAM: LEFT SHOULDER - 2+ VIEW COMPARISON:  Chest radiograph 10/21/2014 FINDINGS: There is no acute fracture or dislocation. The humeral head is high riding within the glenoid. Alignment is otherwise normal, with no evidence of traumatic malalignment. There is severe glenohumeral joint space narrowing with associated osteophytosis and subchondral sclerosis. There is significant irregularity of the greater tuberosity. The soft tissues are unremarkable. IMPRESSION: 1. No acute fracture or dislocation. 2. Severe degenerative change of the left shoulder as above. 3. High-riding humeral head and irregularity of the greater tuberosity likely reflect rotator cuff tendinopathy. Electronically Signed   By: Lesia Hausen M.D.   On: 03/31/2021 13:26    Procedures Procedures   Medications Ordered in ED Medications  pregabalin (LYRICA) capsule 25 mg (has no administration in time range)    ED Course  I have reviewed the triage vital signs and the nursing notes.  Pertinent labs & imaging results that were available during my care of the patient were reviewed by me and considered in my medical decision making (see chart for details).   2:41 PM Patient in no distress, resting.  I discussed the findings with the patient's  daughter and her.  X-ray is reassuring, no broken bones, there is evidence for degenerative changes, however. Labs consistent with prior studies, minimal dehydration, no leukocytosis, and absent fever, low suspicion for occult infection.  No report of fall in the past few days, no new neurodeficits, though she does have obvious diffuse atrophy.  No negation for admission, though would like conversation about facilitating home health services including PT, nursing, and initiation of therapy for neuropathy. MDM Rules/Calculators/A&P MDM Number of Diagnoses or Management Options Bilateral leg pain: established, worsening Weakness: established, worsening   Amount and/or Complexity of Data Reviewed Clinical lab tests: ordered and reviewed Tests in the radiology section of CPT: ordered and reviewed Tests in the medicine section of CPT: reviewed and ordered Decide to obtain previous medical records or to obtain history from someone other than the patient: yes Obtain history from someone other than the patient: yes Review and summarize past medical records: yes Independent visualization of images, tracings, or specimens: yes  Risk of Complications, Morbidity, and/or Mortality Presenting problems: high Diagnostic procedures: high Management options: high  Critical Care Total time providing critical care: < 30 minutes  Patient Progress Patient progress: stable   Final Clinical Impression(s) / ED Diagnoses Final diagnoses:  Bilateral leg pain  Weakness    Rx / DC Orders ED Discharge Orders          Ordered    pregabalin (LYRICA) 25 MG capsule  2 times daily        03/31/21 1440    Home Health        03/31/21 1440    Face-to-face encounter (required for Medicare/Medicaid patients)       Comments: I Gerhard Munch certify that this patient is under my care and that I, or a nurse practitioner or physician's assistant working with me, had a face-to-face encounter that meets the  physician face-to-face encounter requirements with this patient on 03/31/2021. The encounter with the patient was in whole, or in part for the following medical condition(s) which is the primary reason for home health care (List medical condition): Additional orders per primary care.   03/31/21 1440             Gerhard Munch, MD 03/31/21 [phone]

## 2021-03-31 NOTE — Discharge Instructions (Signed)
As discussed, today's evaluation has been somewhat reassuring.  X-rays do not demonstrate new broken bones, and the lab studies are generally unremarkable.  With your neuropathy, you are being prescribed a new medication.  Please take this as prescribed, and discussed that when you follow-up with your physician.  Do not hesitate to return here for concerning changes in your condition.  In addition, a referral to home health has been placed, and you should be contacted in the coming days for an assessment.

## 2021-04-08 ENCOUNTER — Ambulatory Visit (HOSPITAL_COMMUNITY)
Admission: RE | Admit: 2021-04-08 | Discharge: 2021-04-08 | Disposition: A | Discharge status: Home / Self Care | Payer: Medicare Other | Source: Ambulatory Visit | Attending: Family Medicine | Admitting: Family Medicine

## 2021-04-08 ENCOUNTER — Other Ambulatory Visit (HOSPITAL_COMMUNITY): Payer: Self-pay | Admitting: Family Medicine

## 2021-04-08 ENCOUNTER — Other Ambulatory Visit: Payer: Self-pay

## 2021-04-08 DIAGNOSIS — T148XXA Other injury of unspecified body region, initial encounter: Secondary | ICD-10-CM | POA: Insufficient documentation

## 2022-01-30 DEATH — deceased

## 2022-08-29 IMAGING — DX DG LUMBAR SPINE COMPLETE 4+V
5 series · 5 of 5 positions shown · non-contrast
Comparison: 04/28/2015

CLINICAL DATA: Lumbar fracture

EXAM:
LUMBAR SPINE - COMPLETE 4+ VIEW

[l-spine ap]
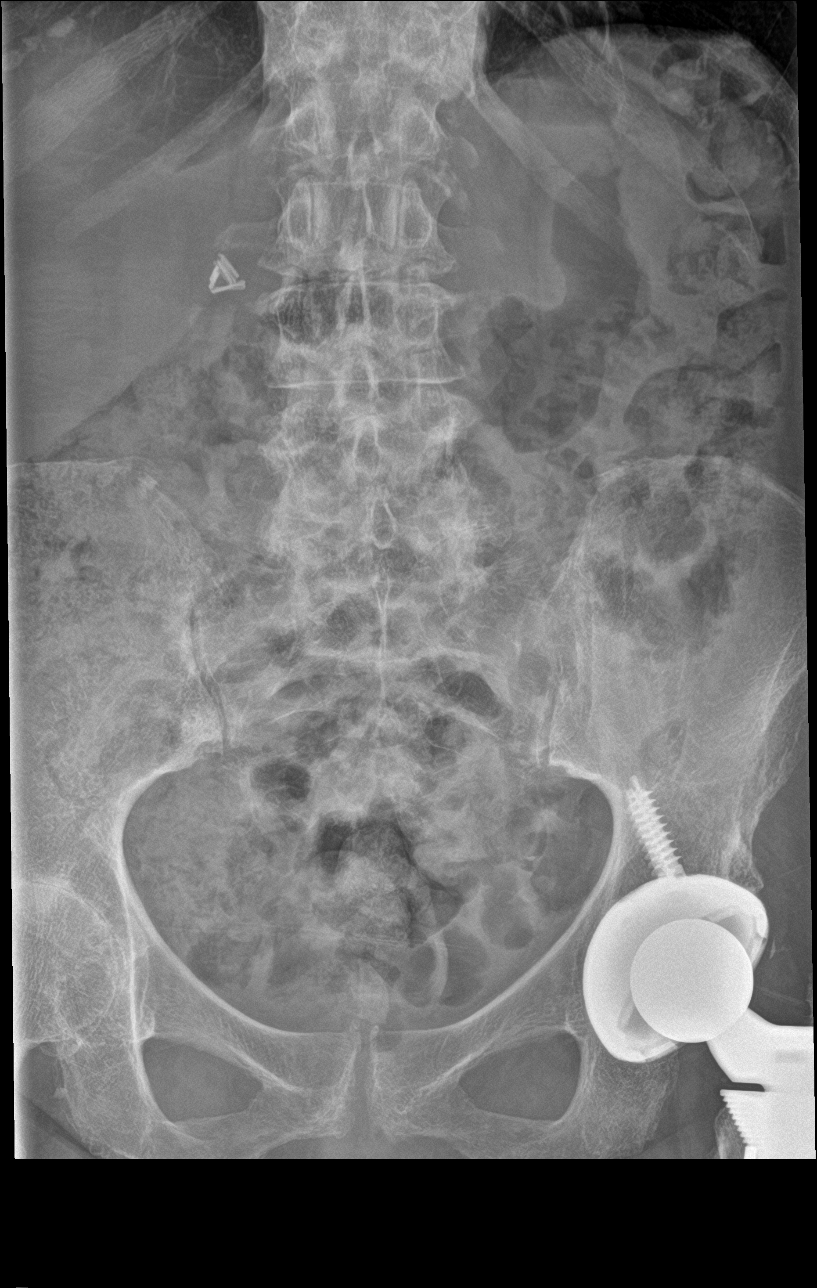

[l-spine obl (1 of 2)]
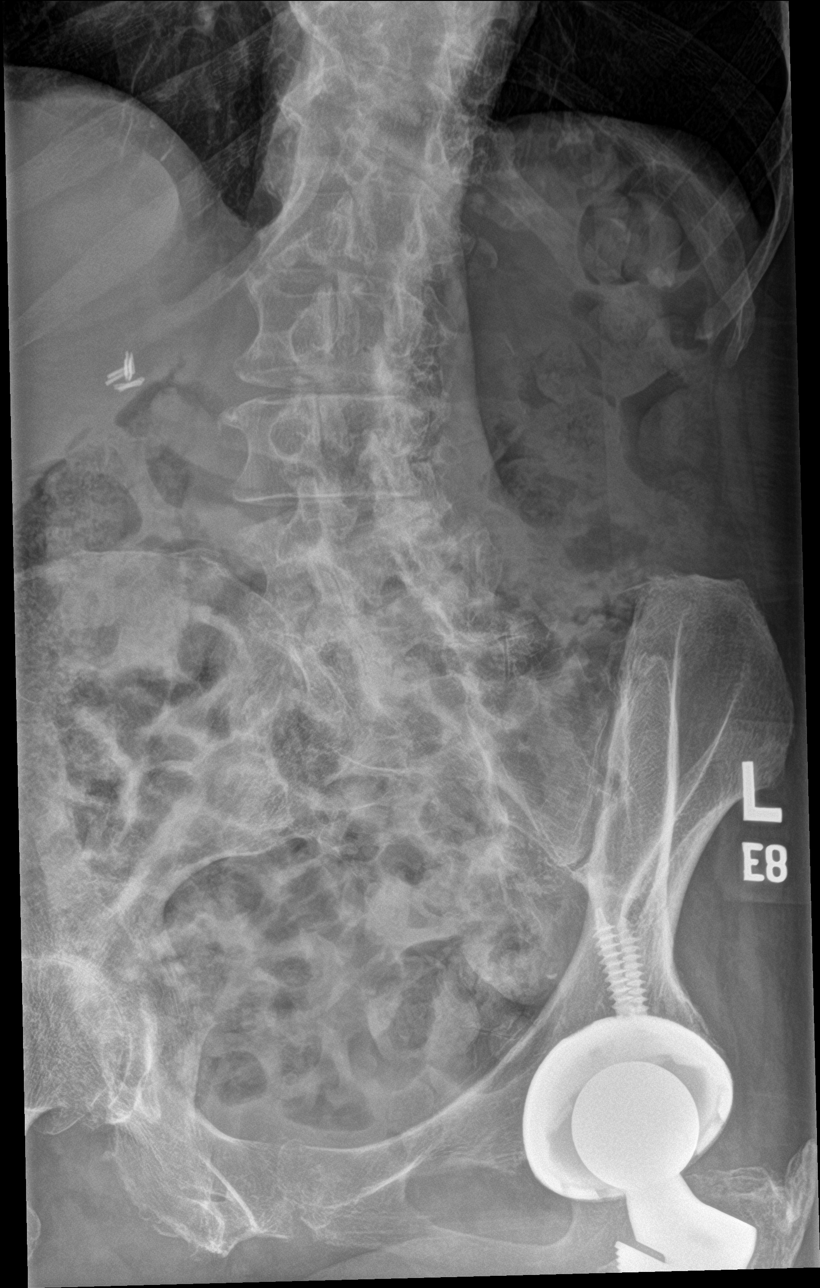

[l-spine obl (2 of 2)]
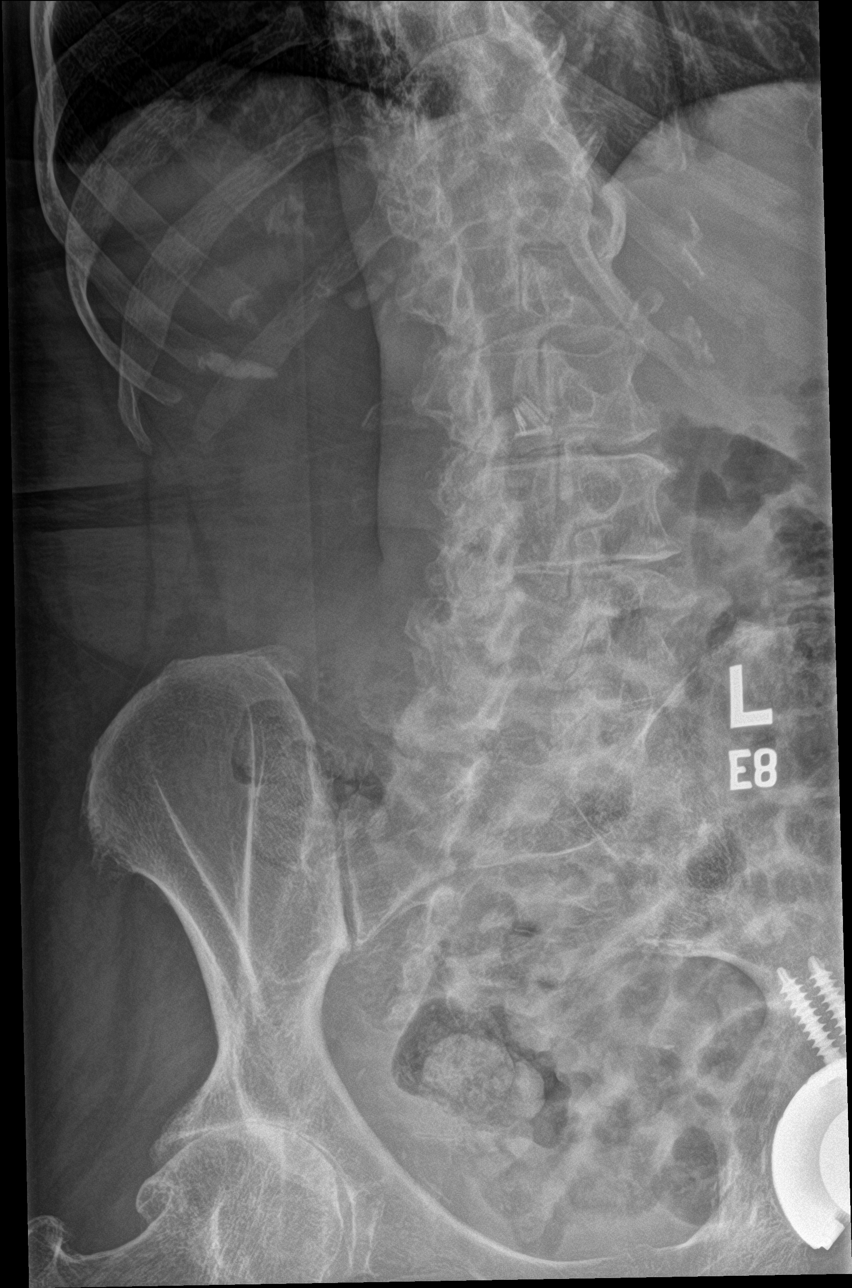

[l-spine lat]
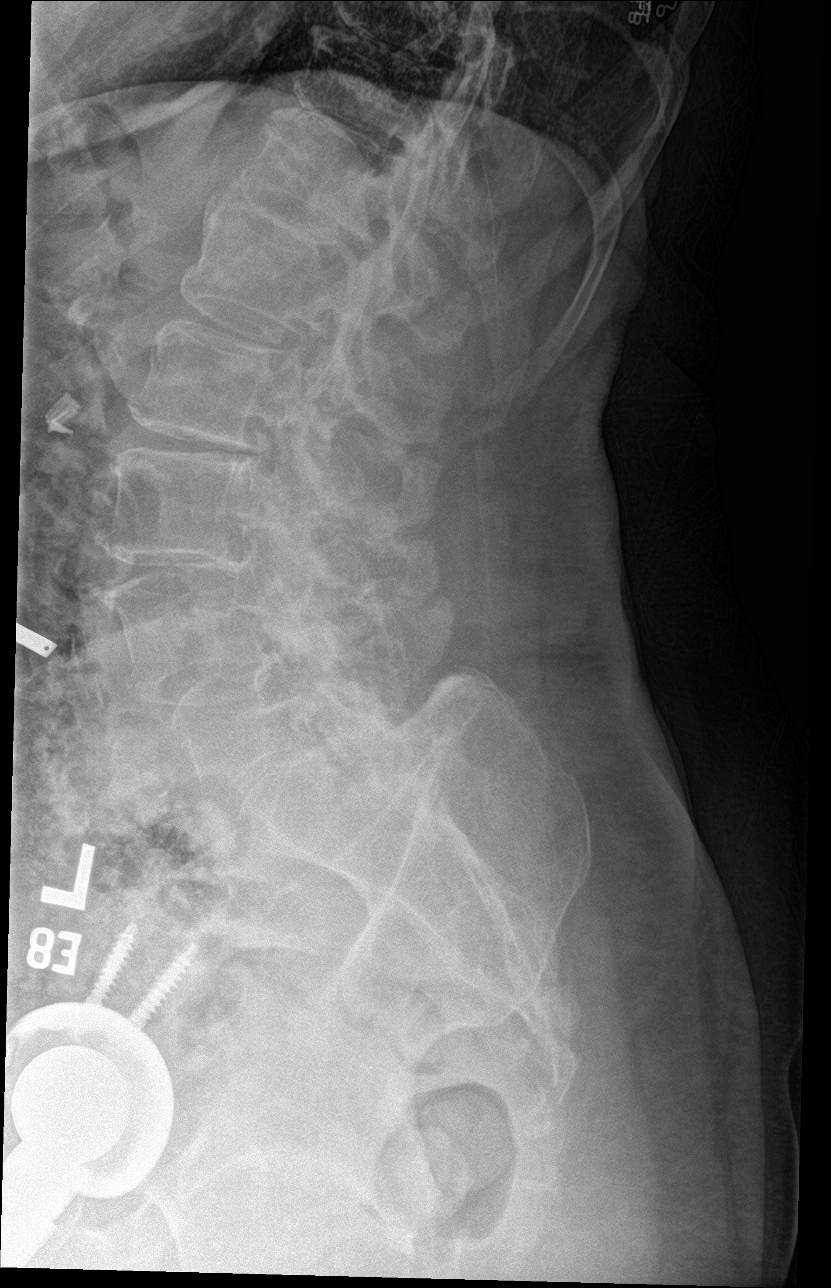

[l-spine spot]
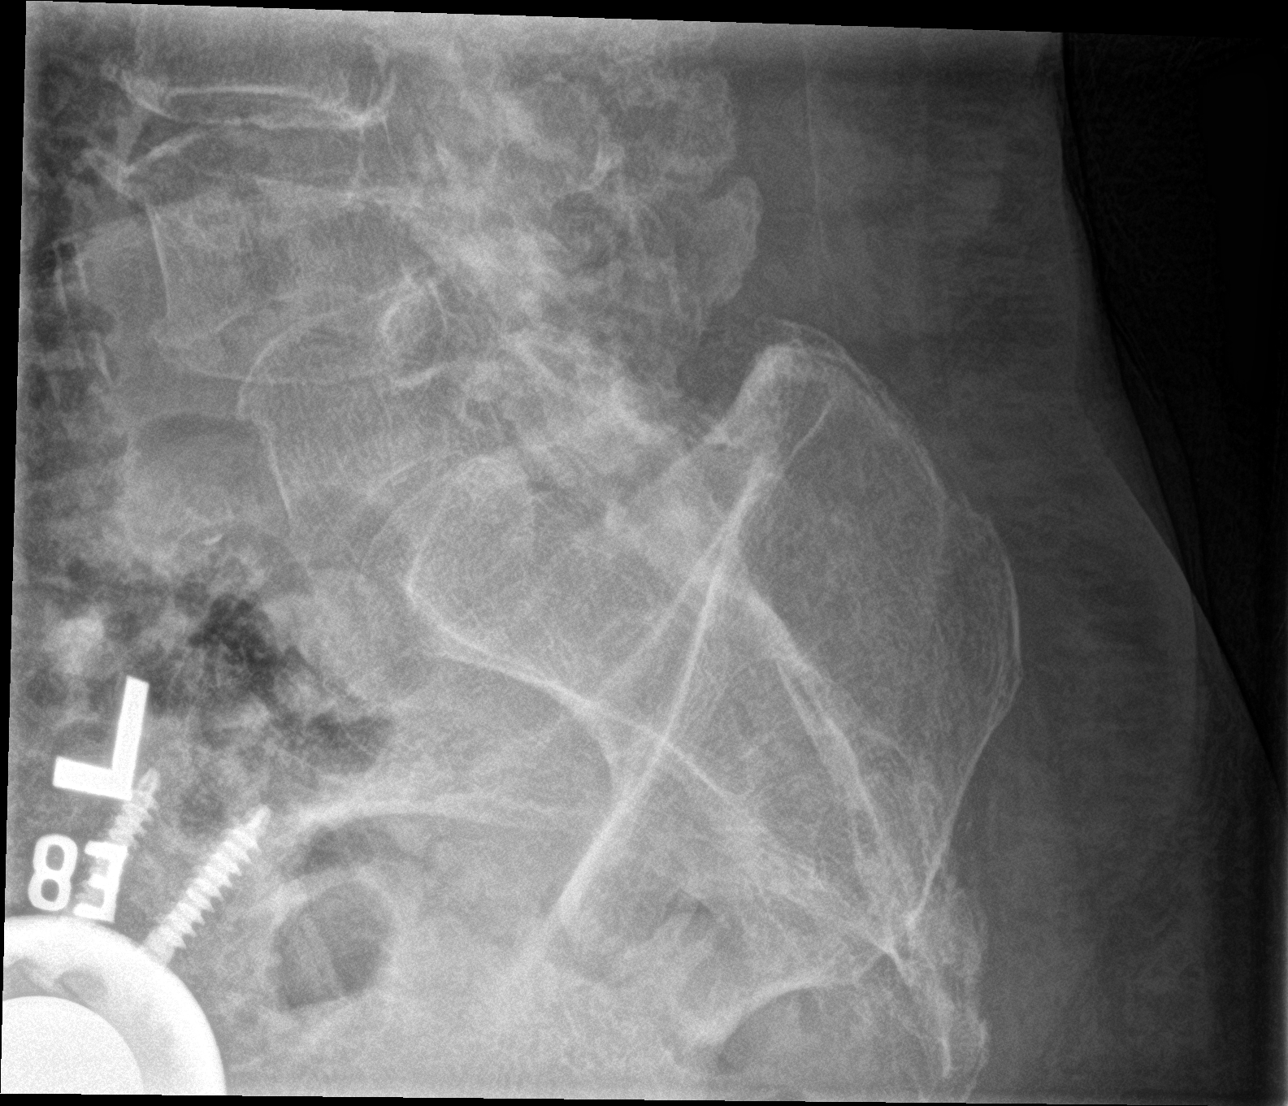

[5 of 5 positions shown; findings below may reference images not displayed]

FINDINGS: Normal lumbar lordosis. Grade 1 anterolisthesis of L4 upon L5 is
unchanged. Vertebral body height has been preserved. No acute
fracture of the lumbar spine. Disc spaces are not optimally
profiled, however, there is at least mild intervertebral disc space
narrowing at L2-L5 with minimal endplate remodeling in keeping with
changes of mild degenerative disc disease. Facet arthrosis of L3-S1
is not well profiled on this examination. The paraspinal soft
tissues are unremarkable. Left total hip arthroplasty and severe
right hip degenerative arthritis are incidentally noted.
IMPRESSION: No acute fracture.

Grossly stable degenerative disc disease throughout the lumbar spine
with associated grade 1 anterolisthesis at L4-5.
